# Patient Record
Sex: Female | Born: 1970
Health system: Southern US, Community
[De-identification: ages and names within clinical notes are randomized; demographics above are authoritative.]

## PROBLEM LIST (undated history)

## (undated) DIAGNOSIS — M1990 Unspecified osteoarthritis, unspecified site: Secondary | ICD-10-CM

## (undated) DIAGNOSIS — R011 Cardiac murmur, unspecified: Secondary | ICD-10-CM

## (undated) DIAGNOSIS — D649 Anemia, unspecified: Secondary | ICD-10-CM

## (undated) DIAGNOSIS — I10 Essential (primary) hypertension: Secondary | ICD-10-CM

## (undated) DIAGNOSIS — N912 Amenorrhea, unspecified: Secondary | ICD-10-CM

## (undated) DIAGNOSIS — J45909 Unspecified asthma, uncomplicated: Secondary | ICD-10-CM

## (undated) DIAGNOSIS — D352 Benign neoplasm of pituitary gland: Secondary | ICD-10-CM

## (undated) HISTORY — DX: Unspecified asthma, uncomplicated: J45.909

## (undated) HISTORY — DX: Amenorrhea, unspecified: N91.2

## (undated) HISTORY — DX: Unspecified osteoarthritis, unspecified site: M19.90

## (undated) HISTORY — DX: Cardiac murmur, unspecified: R01.1

## (undated) HISTORY — DX: Benign neoplasm of pituitary gland: D35.2

## (undated) HISTORY — PX: TUBAL LIGATION: SHX77

---

## 1998-07-18 ENCOUNTER — Encounter: Admission: RE | Admit: 1998-07-18 | Discharge: 1998-10-16 | Payer: Self-pay | Admitting: Internal Medicine

## 1998-07-22 ENCOUNTER — Emergency Department (HOSPITAL_COMMUNITY): Admission: EM | Admit: 1998-07-22 | Discharge: 1998-07-22 | Payer: Self-pay | Admitting: Emergency Medicine

## 1998-07-24 ENCOUNTER — Emergency Department (HOSPITAL_COMMUNITY): Admission: EM | Admit: 1998-07-24 | Discharge: 1998-07-24 | Payer: Self-pay | Admitting: Emergency Medicine

## 2001-12-08 ENCOUNTER — Emergency Department (HOSPITAL_COMMUNITY): Admission: EM | Admit: 2001-12-08 | Discharge: 2001-12-08 | Payer: Self-pay

## 2001-12-09 ENCOUNTER — Inpatient Hospital Stay (HOSPITAL_COMMUNITY): Admission: AD | Admit: 2001-12-09 | Discharge: 2001-12-09 | Payer: Self-pay | Admitting: *Deleted

## 2001-12-09 ENCOUNTER — Encounter: Payer: Self-pay | Admitting: *Deleted

## 2001-12-12 ENCOUNTER — Inpatient Hospital Stay (HOSPITAL_COMMUNITY): Admission: AD | Admit: 2001-12-12 | Discharge: 2001-12-12 | Payer: Self-pay | Admitting: Obstetrics and Gynecology

## 2001-12-15 ENCOUNTER — Inpatient Hospital Stay (HOSPITAL_COMMUNITY): Admission: AD | Admit: 2001-12-15 | Discharge: 2001-12-15 | Payer: Self-pay | Admitting: Obstetrics

## 2001-12-21 ENCOUNTER — Inpatient Hospital Stay (HOSPITAL_COMMUNITY): Admission: AD | Admit: 2001-12-21 | Discharge: 2001-12-21 | Payer: Self-pay | Admitting: Obstetrics & Gynecology

## 2002-11-14 ENCOUNTER — Encounter: Payer: Self-pay | Admitting: Obstetrics & Gynecology

## 2002-11-14 ENCOUNTER — Ambulatory Visit (HOSPITAL_COMMUNITY): Admission: RE | Admit: 2002-11-14 | Discharge: 2002-11-14 | Payer: Self-pay | Admitting: Obstetrics & Gynecology

## 2002-12-12 ENCOUNTER — Inpatient Hospital Stay (HOSPITAL_COMMUNITY): Admission: AD | Admit: 2002-12-12 | Discharge: 2002-12-12 | Payer: Self-pay | Admitting: Obstetrics

## 2003-01-21 ENCOUNTER — Inpatient Hospital Stay (HOSPITAL_COMMUNITY): Admission: AD | Admit: 2003-01-21 | Discharge: 2003-01-21 | Payer: Self-pay | Admitting: Obstetrics

## 2003-01-30 ENCOUNTER — Ambulatory Visit (HOSPITAL_COMMUNITY): Admission: RE | Admit: 2003-01-30 | Discharge: 2003-01-30 | Payer: Self-pay | Admitting: Obstetrics

## 2003-01-30 ENCOUNTER — Encounter: Payer: Self-pay | Admitting: Obstetrics

## 2003-02-15 ENCOUNTER — Inpatient Hospital Stay (HOSPITAL_COMMUNITY): Admission: AD | Admit: 2003-02-15 | Discharge: 2003-02-18 | Payer: Self-pay | Admitting: Obstetrics

## 2003-02-19 ENCOUNTER — Encounter: Admission: RE | Admit: 2003-02-19 | Discharge: 2003-03-21 | Payer: Self-pay | Admitting: Obstetrics

## 2003-02-22 ENCOUNTER — Inpatient Hospital Stay (HOSPITAL_COMMUNITY): Admission: AD | Admit: 2003-02-22 | Discharge: 2003-02-22 | Payer: Self-pay | Admitting: Obstetrics

## 2003-03-22 ENCOUNTER — Encounter: Admission: RE | Admit: 2003-03-22 | Discharge: 2003-04-21 | Payer: Self-pay | Admitting: Obstetrics

## 2003-05-22 ENCOUNTER — Encounter: Admission: RE | Admit: 2003-05-22 | Discharge: 2003-06-21 | Payer: Self-pay | Admitting: Obstetrics

## 2003-07-22 ENCOUNTER — Encounter: Admission: RE | Admit: 2003-07-22 | Discharge: 2003-08-21 | Payer: Self-pay | Admitting: Obstetrics

## 2003-08-22 ENCOUNTER — Encounter: Admission: RE | Admit: 2003-08-22 | Discharge: 2003-09-21 | Payer: Self-pay | Admitting: Obstetrics

## 2003-10-22 ENCOUNTER — Encounter: Admission: RE | Admit: 2003-10-22 | Discharge: 2003-11-21 | Payer: Self-pay | Admitting: Obstetrics

## 2005-01-09 ENCOUNTER — Emergency Department (HOSPITAL_COMMUNITY): Admission: EM | Admit: 2005-01-09 | Discharge: 2005-01-09 | Payer: Self-pay | Admitting: Emergency Medicine

## 2005-03-05 ENCOUNTER — Emergency Department (HOSPITAL_COMMUNITY): Admission: EM | Admit: 2005-03-05 | Discharge: 2005-03-05 | Payer: Self-pay | Admitting: Emergency Medicine

## 2005-11-14 ENCOUNTER — Emergency Department (HOSPITAL_COMMUNITY): Admission: EM | Admit: 2005-11-14 | Discharge: 2005-11-14 | Payer: Self-pay | Admitting: Emergency Medicine

## 2006-11-16 ENCOUNTER — Emergency Department (HOSPITAL_COMMUNITY): Admission: EM | Admit: 2006-11-16 | Discharge: 2006-11-16 | Payer: Self-pay | Admitting: Family Medicine

## 2006-11-20 ENCOUNTER — Emergency Department (HOSPITAL_COMMUNITY): Admission: EM | Admit: 2006-11-20 | Discharge: 2006-11-20 | Payer: Self-pay | Admitting: Family Medicine

## 2011-10-17 ENCOUNTER — Other Ambulatory Visit: Payer: Self-pay | Admitting: Internal Medicine

## 2012-08-16 ENCOUNTER — Ambulatory Visit
Admission: RE | Admit: 2012-08-16 | Discharge: 2012-08-16 | Disposition: A | Discharge status: Home / Self Care | Payer: 59 | Source: Ambulatory Visit | Attending: Orthopedic Surgery | Admitting: Orthopedic Surgery

## 2012-08-16 ENCOUNTER — Other Ambulatory Visit: Payer: Self-pay | Admitting: Orthopedic Surgery

## 2012-08-16 DIAGNOSIS — M25561 Pain in right knee: Secondary | ICD-10-CM

## 2013-06-06 ENCOUNTER — Ambulatory Visit: Payer: Self-pay | Admitting: Obstetrics

## 2013-06-06 ENCOUNTER — Ambulatory Visit (INDEPENDENT_AMBULATORY_CARE_PROVIDER_SITE_OTHER): Payer: 59 | Admitting: Family Medicine

## 2013-06-06 VITALS — BP 144/88 | HR 80 | Temp 98.1°F | Resp 16 | Ht 65.5 in | Wt 231.2 lb

## 2013-06-06 DIAGNOSIS — B9689 Other specified bacterial agents as the cause of diseases classified elsewhere: Secondary | ICD-10-CM

## 2013-06-06 DIAGNOSIS — N946 Dysmenorrhea, unspecified: Secondary | ICD-10-CM

## 2013-06-06 DIAGNOSIS — IMO0002 Reserved for concepts with insufficient information to code with codable children: Secondary | ICD-10-CM

## 2013-06-06 DIAGNOSIS — R109 Unspecified abdominal pain: Secondary | ICD-10-CM

## 2013-06-06 DIAGNOSIS — N926 Irregular menstruation, unspecified: Secondary | ICD-10-CM

## 2013-06-06 DIAGNOSIS — N760 Acute vaginitis: Secondary | ICD-10-CM

## 2013-06-06 DIAGNOSIS — N898 Other specified noninflammatory disorders of vagina: Secondary | ICD-10-CM

## 2013-06-06 LAB — POCT URINALYSIS DIPSTICK
Bilirubin, UA: NEGATIVE
Blood, UA: NEGATIVE
Glucose, UA: NEGATIVE
Ketones, UA: NEGATIVE
Leukocytes, UA: NEGATIVE
Nitrite, UA: NEGATIVE
Protein, UA: NEGATIVE
Spec Grav, UA: 1.02
Urobilinogen, UA: 0.2
pH, UA: 5

## 2013-06-06 LAB — POCT UA - MICROSCOPIC ONLY
Casts, Ur, LPF, POC: NEGATIVE
Crystals, Ur, HPF, POC: NEGATIVE

## 2013-06-06 LAB — POCT WET PREP WITH KOH
Bacteria Wet Prep HPF POC: 2
KOH Prep POC: NEGATIVE
Yeast Wet Prep HPF POC: NEGATIVE

## 2013-06-06 LAB — POCT URINE PREGNANCY: Preg Test, Ur: NEGATIVE

## 2013-06-06 MED ORDER — METRONIDAZOLE 500 MG PO TABS: ORAL_TABLET | ORAL | Status: DC

## 2013-06-06 NOTE — Patient Instructions (Addendum)
You should receive a call or letter about your lab results within the next week to 10 days. Start the antibiotic for vaginal discharge - see below.  If menses continue to be irregular or clots passed again - return to clinic for evaluation.  Return to the clinic or go to the nearest emergency room if any of your symptoms worsen or new symptoms occur.   Bacterial Vaginosis Bacterial vaginosis (BV) is a vaginal infection where the normal balance of bacteria in the vagina is disrupted. The normal balance is then replaced by an overgrowth of certain bacteria. There are several different kinds of bacteria that can cause BV. BV is the most common vaginal infection in women of childbearing age. CAUSES   The cause of BV is not fully understood. BV develops when there is an increase or imbalance of harmful bacteria.  Some activities or behaviors can upset the normal balance of bacteria in the vagina and put women at increased risk including:  Having a new sex partner or multiple sex partners.  Douching.  Using an intrauterine device (IUD) for contraception.  It is not clear what role sexual activity plays in the development of BV. However, women that have never had sexual intercourse are rarely infected with BV. Women do not get BV from toilet seats, bedding, swimming pools or from touching objects around them.  SYMPTOMS   Grey vaginal discharge.  A fish-like odor with discharge, especially after sexual intercourse.  Itching or burning of the vagina and vulva.  Burning or pain with urination.  Some women have no signs or symptoms at all. DIAGNOSIS  Your caregiver must examine the vagina for signs of BV. Your caregiver will perform lab tests and look at the sample of vaginal fluid through a microscope. They will look for bacteria and abnormal cells (clue cells), a pH test higher than 4.5, and a positive amine test all associated with BV.  RISKS AND COMPLICATIONS   Pelvic inflammatory  disease (PID).  Infections following gynecology surgery.  Developing HIV.  Developing herpes virus. TREATMENT  Sometimes BV will clear up without treatment. However, all women with symptoms of BV should be treated to avoid complications, especially if gynecology surgery is planned. Female partners generally do not need to be treated. However, BV may spread between female sex partners so treatment is helpful in preventing a recurrence of BV.   BV may be treated with antibiotics. The antibiotics come in either pill or vaginal cream forms. Either can be used with nonpregnant or pregnant women, but the recommended dosages differ. These antibiotics are not harmful to the baby.  BV can recur after treatment. If this happens, a second round of antibiotics will often be prescribed.  Treatment is important for pregnant women. If not treated, BV can cause a premature delivery, especially for a pregnant woman who had a premature birth in the past. All pregnant women who have symptoms of BV should be checked and treated.  For chronic reoccurrence of BV, treatment with a type of prescribed gel vaginally twice a week is helpful. HOME CARE INSTRUCTIONS   Finish all medication as directed by your caregiver.  Do not have sex until treatment is completed.  Tell your sexual partner that you have a vaginal infection. They should see their caregiver and be treated if they have problems, such as a mild rash or itching.  Practice safe sex. Use condoms. Only have 1 sex partner. PREVENTION  Basic prevention steps can help reduce the risk of  upsetting the natural balance of bacteria in the vagina and developing BV:  Do not have sexual intercourse (be abstinent).  Do not douche.  Use all of the medicine prescribed for treatment of BV, even if the signs and symptoms go away.  Tell your sex partner if you have BV. That way, they can be treated, if needed, to prevent reoccurrence. SEEK MEDICAL CARE IF:   Your  symptoms are not improving after 3 days of treatment.  You have increased discharge, pain, or fever. MAKE SURE YOU:   Understand these instructions.  Will watch your condition.  Will get help right away if you are not doing well or get worse. FOR MORE INFORMATION  Division of STD Prevention (DSTDP), Centers for Disease Control and Prevention: [employer] American Social Health Association (ASHA): www.ashastd.org  Document Released: 11/29/2005 Document Revised: 02/21/2012 Document Reviewed: 05/22/2009 [facility] Patient Information 2014 [place], [place].

## 2013-06-06 NOTE — Progress Notes (Signed)
Subjective:    Patient ID: Whitney Gray, female    DOB: October 13, 1971, 42 y.o.   MRN: [id number]  HPI Whitney Gray is a 42 y.o. female  2 months ago - went to bathroom - not sure if tampon was taken out or not.  Has not felt strings or FB, but not sure. Slight vaginal discharge on and off past 2 months.  LMP last week - lighter than usual, but had some lingering bleeding, then clots last night.  No menstrual bleeding today.  Occasional cramps after intercourse past month. No contraception. No STI's in past. No extramarital intercourse.  No recent abx.   Last pap 2 years ago - normal. Would like this repeated tonight if possible. Declines sti testing.  Hx BTL.   Review of Systems  Constitutional: Negative for fever and chills.  Gastrointestinal: Positive for abdominal pain (slight lower cramping after intercourse only. ). Negative for nausea, vomiting, diarrhea, blood in stool and anal bleeding.       No recent n/v.  Seen by PCP few weeks ago for illness - better now.  Genitourinary: Positive for vaginal bleeding, vaginal discharge and menstrual problem. Negative for dysuria, urgency, frequency and hematuria.  Skin: Negative for rash.       Objective:   Physical Exam  Vitals reviewed. Constitutional: She is oriented to person, place, and time. She appears well-developed and well-nourished.  Pulmonary/Chest: Effort normal.  Abdominal: Soft. Bowel sounds are normal. She exhibits no distension. There is no rebound and no guarding.  nontender but feeling of fullness in lower suprapubic area.   Genitourinary: Uterus normal. There is no rash, tenderness, lesion or injury on the right labia. There is no rash, tenderness, lesion or injury on the left labia. Cervix exhibits no motion tenderness and no discharge. Right adnexum displays no mass, no tenderness and no fullness. Left adnexum displays no mass, no tenderness and no fullness. No erythema or bleeding around the vagina. No foreign body  around the vagina. No signs of injury around the vagina. Vaginal discharge (small amount white d/c in vault ) found.  Neurological: She is alert and oriented to person, place, and time.  Skin: Skin is warm and dry. No rash noted.  Psychiatric: She has a normal mood and affect. Her behavior is normal.    Results for orders placed in visit on 06/06/13  POCT URINALYSIS DIPSTICK      Result Value Range   Color, UA yellow     Clarity, UA hazy     Glucose, UA neg     Bilirubin, UA neg     Ketones, UA neg     Spec Grav, UA 1.020     Blood, UA neg     pH, UA 5.0     Protein, UA neg     Urobilinogen, UA 0.2     Nitrite, UA neg     Leukocytes, UA Negative    POCT UA - MICROSCOPIC ONLY      Result Value Range   WBC, Ur, HPF, POC 0-1     RBC, urine, microscopic 0-1     Bacteria, U Microscopic small     Mucus, UA neg     Epithelial cells, urine per micros 2-6     Crystals, Ur, HPF, POC neg     Casts, Ur, LPF, POC neg     Yeast, UA neg    POCT URINE PREGNANCY      Result Value Range   Preg Test,  Ur Negative    POCT WET PREP WITH KOH      Result Value Range   Trichomonas, UA Negative     Clue Cells Wet Prep HPF POC 8-12     Epithelial Wet Prep HPF POC neg     Yeast Wet Prep HPF POC neg     Bacteria Wet Prep HPF POC 2     RBC Wet Prep HPF POC 0-1     WBC Wet Prep HPF POC 4-6     KOH Prep POC Negative        Assessment & Plan:  Whitney Gray is a 42 y.o. female Abdominal  pain, other specified site - Plan: POCT urinalysis dipstick, POCT UA - Microscopic Only, POCT urine pregnancy, POCT Wet Prep with KOH, Pap IG w/ reflex to HPV when ASC-U Vaginal discharge - Plan: POCT urine pregnancy, POCT Wet Prep with KOH, metroNIDAZOLE (FLAGYL) 500 MG tablet, Bacterial vaginosis - Plan: metroNIDAZOLE (FLAGYL) 500 MG tablet.  No retained tampon visualized.  Recheck if clots passed again or irregular menses or abdominal symptoms not improving.   Abnormal menses - Plan: POCT urine pregnancy,  POCT Wet Prep with KOH, Pap IG w/ reflex to HPV when ASC-U.  As above.   Meds ordered this encounter  Medications  . metroNIDAZOLE (FLAGYL) 500 MG tablet    Sig: 1 pill by mouth twice per day.  Avoid any alcohol while taking this medicine.    Dispense:  14 tablet    Refill:  0   Patient Instructions  You should receive a call or letter about your lab results within the next week to 10 days. Start the antibiotic for vaginal discharge - see below.  If menses continue to be irregular or clots passed again - return to clinic for evaluation.  Return to the clinic or go to the nearest emergency room if any of your symptoms worsen or new symptoms occur.

## 2013-06-08 LAB — PAP IG W/ RFLX HPV ASCU

## 2013-07-02 ENCOUNTER — Ambulatory Visit: Payer: Self-pay | Admitting: Obstetrics

## 2015-02-14 DIAGNOSIS — E559 Vitamin D deficiency, unspecified: Secondary | ICD-10-CM | POA: Insufficient documentation

## 2015-02-14 DIAGNOSIS — D573 Sickle-cell trait: Secondary | ICD-10-CM | POA: Insufficient documentation

## 2015-03-13 DIAGNOSIS — J309 Allergic rhinitis, unspecified: Secondary | ICD-10-CM | POA: Insufficient documentation

## 2015-06-23 ENCOUNTER — Encounter: Payer: Self-pay | Admitting: Obstetrics

## 2015-06-23 ENCOUNTER — Ambulatory Visit (INDEPENDENT_AMBULATORY_CARE_PROVIDER_SITE_OTHER): Payer: 59 | Admitting: Obstetrics

## 2015-06-23 ENCOUNTER — Other Ambulatory Visit: Payer: Self-pay | Admitting: Obstetrics

## 2015-06-23 VITALS — BP 131/85 | HR 73 | Temp 98.2°F | Ht 63.0 in | Wt 224.0 lb

## 2015-06-23 DIAGNOSIS — Z113 Encounter for screening for infections with a predominantly sexual mode of transmission: Secondary | ICD-10-CM

## 2015-06-23 DIAGNOSIS — Z0000 Encounter for general adult medical examination without abnormal findings: Secondary | ICD-10-CM

## 2015-06-23 DIAGNOSIS — Z01419 Encounter for gynecological examination (general) (routine) without abnormal findings: Secondary | ICD-10-CM

## 2015-06-23 DIAGNOSIS — E669 Obesity, unspecified: Secondary | ICD-10-CM

## 2015-06-23 DIAGNOSIS — Z124 Encounter for screening for malignant neoplasm of cervix: Secondary | ICD-10-CM | POA: Diagnosis not present

## 2015-06-23 MED ORDER — PHENTERMINE HCL 37.5 MG PO CAPS: 37.5000 mg | ORAL_CAPSULE | ORAL | Status: DC

## 2015-06-23 NOTE — Progress Notes (Signed)
Subjective:        Whitney Gray is a 44 y.o. female here for a routine exam.  Current complaints: Undesirable weight gain.    Personal health questionnaire:  Is patient Ashkenazi Jewish, have a family history of breast and/or ovarian cancer: no Is there a family history of uterine cancer diagnosed at age < 51, gastrointestinal cancer, urinary tract cancer, family member who is a Field seismologist syndrome-associated carrier: no Is the patient overweight and hypertensive, family history of diabetes, personal history of gestational diabetes, preeclampsia or PCOS: no Is patient over 23, have PCOS,  family history of premature CHD under age 104, diabetes, smoke, have hypertension or peripheral artery disease:  no At any time, has a partner hit, kicked or otherwise hurt or frightened you?: no Over the past 2 weeks, have you felt down, depressed or hopeless?: no Over the past 2 weeks, have you felt little interest or pleasure in doing things?:no   Gynecologic History No LMP recorded. Contraception: tubal ligation Last Pap: 2015. Results were: normal Last mammogram: 2016. Results were: normal  Obstetric History OB History  No data available    Past Medical History  Diagnosis Date  . Arthritis     Past Surgical History  Procedure Laterality Date  . Tubal ligation       Current outpatient prescriptions:  .  phentermine 37.5 MG capsule, Take 1 capsule (37.5 mg total) by mouth every morning., Disp: 30 capsule, Rfl: 2 Allergies  Allergen Reactions  . Codeine Nausea Only    History  Substance Use Topics  . Smoking status: Never Smoker   . Smokeless tobacco: Not on file  . Alcohol Use: No    History reviewed. No pertinent family history.    Review of Systems  Constitutional: negative for fatigue and weight loss Respiratory: negative for cough and wheezing Cardiovascular: negative for chest pain, fatigue and palpitations Gastrointestinal: negative for abdominal pain and change in  bowel habits Musculoskeletal:negative for myalgias Neurological: negative for gait problems and tremors Behavioral/Psych: negative for abusive relationship, depression Endocrine: negative for temperature intolerance   Genitourinary:negative for abnormal menstrual periods, genital lesions, hot flashes, sexual problems and vaginal discharge Integument/breast: negative for breast lump, breast tenderness, nipple discharge and skin lesion(s)    Objective:       BP 131/85 mmHg  Pulse 73  Temp(Src) 98.2 F (36.8 C)  Ht 5\' 3"  (1.6 m)  Wt 224 lb (101.606 kg)  BMI 39.69 kg/m2 General:   alert  Skin:   no rash or abnormalities  Lungs:   clear to auscultation bilaterally  Heart:   regular rate and rhythm, S1, S2 normal, no murmur, click, rub or gallop  Breasts:   normal without suspicious masses, skin or nipple changes or axillary nodes  Abdomen:  normal findings: no organomegaly, soft, non-tender and no hernia  Pelvis:  External genitalia: normal general appearance Urinary system: urethral meatus normal and bladder without fullness, nontender Vaginal: normal without tenderness, induration or masses Cervix: normal appearance Adnexa: normal bimanual exam Uterus: anteverted and non-tender, normal size   Lab Review Urine pregnancy test Labs reviewed yes Radiologic studies reviewed yes    Assessment:    Healthy female exam.    Obesity.  Interested in Conservation officer, nature.   Plan:    Education reviewed: calcium supplements, low fat, low cholesterol diet, safe sex/STD prevention, self breast exams and weight bearing exercise. Mammogram ordered. Follow up in: 1 year.  Phentermine Rx for weight management, along with diet and  exercise.  Meds ordered this encounter  Medications  . phentermine 37.5 MG capsule    Sig: Take 1 capsule (37.5 mg total) by mouth every morning.    Dispense:  30 capsule    Refill:  2   Orders Placed This Encounter  Procedures  . HIV antibody  . Hepatitis  B surface antigen  . RPR  . Hepatitis C antibody  . Ambulatory referral to Internal Medicine    Referral Priority:  Routine    Referral Type:  Consultation    Referral Reason:  Specialty Services Required    Requested Specialty:  Internal Medicine    Number of Visits Requested:  1

## 2015-06-23 NOTE — Addendum Note (Signed)
Addended by: Carole Binning on: 06/23/2015 04:25 PM   Modules accepted: Orders

## 2015-06-27 LAB — PAP LB, CT-NG NAA, HPV HIGH-RISK
Chlamydia, Nuc. Acid Amp: NEGATIVE
Gonococcus, Nuc. Acid Amp: NEGATIVE
HPV, high-risk: NEGATIVE
PAP SMEAR COMMENT: 0

## 2015-07-03 ENCOUNTER — Telehealth: Payer: Self-pay | Admitting: *Deleted

## 2015-07-03 NOTE — Telephone Encounter (Signed)
Patient contacted the office requesting her lab results. Lab results reviewed and patient verbalized understanding.

## 2015-07-10 ENCOUNTER — Ambulatory Visit (INDEPENDENT_AMBULATORY_CARE_PROVIDER_SITE_OTHER): Payer: 59 | Admitting: Family

## 2015-07-10 ENCOUNTER — Encounter: Payer: Self-pay | Admitting: Family

## 2015-07-10 VITALS — BP 132/96 | HR 73 | Temp 98.0°F | Resp 18 | Ht 63.0 in | Wt 224.0 lb

## 2015-07-10 DIAGNOSIS — L219 Seborrheic dermatitis, unspecified: Secondary | ICD-10-CM

## 2015-07-10 DIAGNOSIS — J302 Other seasonal allergic rhinitis: Secondary | ICD-10-CM | POA: Diagnosis not present

## 2015-07-10 DIAGNOSIS — E669 Obesity, unspecified: Secondary | ICD-10-CM

## 2015-07-10 DIAGNOSIS — D352 Benign neoplasm of pituitary gland: Secondary | ICD-10-CM | POA: Diagnosis not present

## 2015-07-10 DIAGNOSIS — L210 Seborrhea capitis: Secondary | ICD-10-CM | POA: Insufficient documentation

## 2015-07-10 NOTE — Progress Notes (Signed)
Subjective:    Patient ID: Whitney Gray, female    DOB: 21-Jun-1971, 44 y.o.   MRN: [id number]  Chief Complaint  Patient presents with  . Establish Care    states that she is having a bad year with allergies and having trouble breathing at night, would also like to see if she could get a rx shampoo to help with dandruff    HPI:  Whitney Gray is a 44 y.o. female with a PMH of arthritis, prolactinemia, pituitary adenoma, and heart murmur who presents today for an office visit to establish care.   1.) Dandruff - Associated symptom of dandruff located in her hair has been going on for several months. Reports that the shampoo that was recommend by her hair stylist has caused her to increase dandruff. She has since stopped using that shampoo. Modifying factors include Head and Shoulders which has not helped very much. Would like to know any alternative shampoos to help with dandruff.  2.) Allergies - Associated symptoms of increased seasonal allergies and has been going on since the start of the spring season.. Modifying factors include Claratin which significantly helped with her symptoms. Timing of symptoms is worse at night. She has since discontinued taking Claritin and does notice her symptoms are returning.    3.) Pituitary Adenoma - Previously diagnosed with a pituitary adenoma which was seen on MRI. Formerly maintained on cabergoline for control. Reports this increased her migraine headaches. She recently had a prolactin level drawn, however does not know the results as they were done with a previous provider.   Allergies  Allergen Reactions  . Codeine Nausea Only     Outpatient Prescriptions Prior to Visit  Medication Sig Dispense Refill  . phentermine 37.5 MG capsule Take 1 capsule (37.5 mg total) by mouth every morning. 30 capsule 2   No facility-administered medications prior to visit.     Past Medical History  Diagnosis Date  . Arthritis     Bilateral knees  .  Heart murmur   . Prolactinoma   . Amenorrhea   . Pituitary adenoma      Past Surgical History  Procedure Laterality Date  . Tubal ligation    . Cesarean section       Family History  Problem Relation Age of Onset  . Asthma Mother   . Alcohol abuse Father   . Arthritis Father   . Heart disease Father   . Prostate cancer Maternal Grandfather   . Diabetes Maternal Grandfather      History   Social History  . Marital Status: Married    Spouse Name: N/A  . Number of Children: 2  . Years of Education: 18   Occupational History  . [employer]    Social History Main Topics  . Smoking status: Never Smoker   . Smokeless tobacco: Never Used  . Alcohol Use: No  . Drug Use: No  . Sexual Activity:    Partners: Male   Other Topics Concern  . Not on file   Social History Narrative   Fun: Play games   Denies abuse and feels safe at home.   Denies religious beliefs effecting health care.     Review of Systems  Constitutional: Positive for chills. Negative for fever.  HENT: Positive for congestion, rhinorrhea and sneezing.   Respiratory: Negative for chest tightness and shortness of breath.       Objective:    BP 132/96 mmHg  Pulse 73  Temp(Src)  98 F (36.7 C) (Oral)  Resp 18  Ht 5\' 3"  (1.6 m)  Wt 224 lb (101.606 kg)  BMI 39.69 kg/m2  SpO2 93% Nursing note and vital signs reviewed.  Physical Exam  Constitutional: She is oriented to person, place, and time. She appears well-developed and well-nourished. No distress.  HENT:  Right Ear: Hearing, tympanic membrane, external ear and ear canal normal.  Left Ear: Hearing, tympanic membrane, external ear and ear canal normal.  Nose: Nose normal.  Mouth/Throat: Uvula is midline, oropharynx is clear and moist and mucous membranes are normal.  Cardiovascular: Normal rate, regular rhythm, normal heart sounds and intact distal pulses.   Pulmonary/Chest: Effort normal and breath sounds normal.  Neurological: She is alert  and oriented to person, place, and time.  Skin: Skin is warm and dry.  Psychiatric: She has a normal mood and affect. Her behavior is normal. Judgment and thought content normal.       Assessment & Plan:   Problem List Items Addressed This Visit    None

## 2015-07-10 NOTE — Assessment & Plan Note (Signed)
BMI of 39.69. Emphasized the importance of weight loss through diet and exercise with lifestyle/behavior change. Obtain A1c to rule out diabetes. Continue current dosage of phentermine with caution secondary to increased blood pressure.

## 2015-07-10 NOTE — Assessment & Plan Note (Signed)
Appears stable at present. Obtain prolactin level. Follow up pending lab work.

## 2015-07-10 NOTE — Assessment & Plan Note (Signed)
Symptoms consistent with uncontrolled allergies. Restart Claratin daily. Advised to use Afrin sparingly secondary to rebound congestion. Consider addition of singulair if allergies remain uncontrolled as she had nosebleeds from the inhaled corticosteroids.

## 2015-07-10 NOTE — Patient Instructions (Addendum)
Thank you for choosing [facility].  Summary/Instructions:  Please start selsun blue for your dandruff.   Please start Claratin for your allergies. Be cautious with Afrin.   If your symptoms worsen or fail to improve, please contact our office for further instruction, or in case of emergency go directly to the emergency room at the closest medical facility.    Allergic Rhinitis Allergic rhinitis is when the mucous membranes in the nose respond to allergens. Allergens are particles in the air that cause your body to have an allergic reaction. This causes you to release allergic antibodies. Through a chain of events, these eventually cause you to release histamine into the blood stream. Although meant to protect the body, it is this release of histamine that causes your discomfort, such as frequent sneezing, congestion, and an itchy, runny nose.  CAUSES  Seasonal allergic rhinitis (hay fever) is caused by pollen allergens that may come from grasses, trees, and weeds. Year-round allergic rhinitis (perennial allergic rhinitis) is caused by allergens such as house dust mites, pet dander, and mold spores.  SYMPTOMS   Nasal stuffiness (congestion).  Itchy, runny nose with sneezing and tearing of the eyes. DIAGNOSIS  Your health care provider can help you determine the allergen or allergens that trigger your symptoms. If you and your health care provider are unable to determine the allergen, skin or blood testing may be used. TREATMENT  Allergic rhinitis does not have a cure, but it can be controlled by:  Medicines and allergy shots (immunotherapy).  Avoiding the allergen. Hay fever may often be treated with antihistamines in pill or nasal spray forms. Antihistamines block the effects of histamine. There are over-the-counter medicines that may help with nasal congestion and swelling around the eyes. Check with your health care provider before taking or giving this medicine.  If avoiding  the allergen or the medicine prescribed do not work, there are many new medicines your health care provider can prescribe. Stronger medicine may be used if initial measures are ineffective. Desensitizing injections can be used if medicine and avoidance does not work. Desensitization is when a patient is given ongoing shots until the body becomes less sensitive to the allergen. Make sure you follow up with your health care provider if problems continue. HOME CARE INSTRUCTIONS It is not possible to completely avoid allergens, but you can reduce your symptoms by taking steps to limit your exposure to them. It helps to know exactly what you are allergic to so that you can avoid your specific triggers. SEEK MEDICAL CARE IF:   You have a fever.  You develop a cough that does not stop easily (persistent).  You have shortness of breath.  You start wheezing.  Symptoms interfere with normal daily activities. Document Released: 08/24/2001 Document Revised: 12/04/2013 Document Reviewed: 08/06/2013 [facility] Patient Information 2015 [place], [place]. This information is not intended to replace advice given to you by your health care provider. Make sure you discuss any questions you have with your health care provider.

## 2015-07-10 NOTE — Progress Notes (Signed)
Pre visit review using our clinic review tool, if applicable. No additional management support is needed unless otherwise documented below in the visit note. 

## 2016-06-23 ENCOUNTER — Ambulatory Visit: Payer: 59 | Admitting: Obstetrics

## 2016-07-07 ENCOUNTER — Ambulatory Visit: Payer: 59 | Admitting: Obstetrics

## 2016-08-12 ENCOUNTER — Ambulatory Visit (INDEPENDENT_AMBULATORY_CARE_PROVIDER_SITE_OTHER): Payer: 59 | Admitting: Obstetrics

## 2016-08-12 ENCOUNTER — Encounter: Payer: Self-pay | Admitting: *Deleted

## 2016-08-12 ENCOUNTER — Encounter: Payer: Self-pay | Admitting: Obstetrics

## 2016-08-12 VITALS — BP 128/81 | HR 95 | Temp 99.0°F | Ht 63.0 in | Wt 240.1 lb

## 2016-08-12 DIAGNOSIS — M25569 Pain in unspecified knee: Secondary | ICD-10-CM

## 2016-08-12 DIAGNOSIS — Z124 Encounter for screening for malignant neoplasm of cervix: Secondary | ICD-10-CM

## 2016-08-12 DIAGNOSIS — Z01419 Encounter for gynecological examination (general) (routine) without abnormal findings: Secondary | ICD-10-CM

## 2016-08-12 DIAGNOSIS — Z1239 Encounter for other screening for malignant neoplasm of breast: Secondary | ICD-10-CM

## 2016-08-12 MED ORDER — IBUPROFEN 800 MG PO TABS: 800.0000 mg | ORAL_TABLET | Freq: Three times a day (TID) | ORAL | 5 refills | Status: DC | PRN

## 2016-08-12 NOTE — Progress Notes (Signed)
Patient ID: Whitney Gray, female   DOB: 01-19-1971, 45 y.o.   MRN: [id number]   Subjective:        Whitney Gray is a 45 y.o. female here for a routine exam.  Current complaints: None.    Personal health questionnaire:  Is patient Ashkenazi Jewish, have a family history of breast and/or ovarian cancer: no Is there a family history of uterine cancer diagnosed at age < 58, gastrointestinal cancer, urinary tract cancer, family member who is a Field seismologist syndrome-associated carrier: no Is the patient overweight and hypertensive, family history of diabetes, personal history of gestational diabetes, preeclampsia or PCOS: no Is patient over 47, have PCOS,  family history of premature CHD under age 46, diabetes, smoke, have hypertension or peripheral artery disease:  no At any time, has a partner hit, kicked or otherwise hurt or frightened you?: no Over the past 2 weeks, have you felt down, depressed or hopeless?: no Over the past 2 weeks, have you felt little interest or pleasure in doing things?:no   Gynecologic History No LMP recorded (lmp unknown). Contraception: tubal ligation Last Pap: 2016. Results were: normal Last mammogram: ~3 yrs. . Results were: normal  Obstetric History OB History  No data available    Past Medical History:  Diagnosis Date  . Amenorrhea   . Arthritis    Bilateral knees  . Heart murmur   . Pituitary adenoma ([place])   . Prolactinoma [facility])     Past Surgical History:  Procedure Laterality Date  . CESAREAN SECTION    . TUBAL LIGATION       Current Outpatient Prescriptions:  .  phentermine 37.5 MG capsule, Take 1 capsule (37.5 mg total) by mouth every morning. (Patient not taking: Reported on 08/12/2016), Disp: 30 capsule, Rfl: 2 Allergies  Allergen Reactions  . Codeine Nausea Only    Social History  Substance Use Topics  . Smoking status: Never Smoker  . Smokeless tobacco: Never Used  . Alcohol use No    Family History  Problem Relation Age of  Onset  . Asthma Mother   . Alcohol abuse Father   . Arthritis Father   . Heart disease Father   . Prostate cancer Maternal Grandfather   . Diabetes Maternal Grandfather       Review of Systems  Constitutional: negative for fatigue and weight loss Respiratory: negative for cough and wheezing Cardiovascular: negative for chest pain, fatigue and palpitations Gastrointestinal: negative for abdominal pain and change in bowel habits Musculoskeletal:negative for myalgias Neurological: negative for gait problems and tremors Behavioral/Psych: negative for abusive relationship, depression Endocrine: negative for temperature intolerance   Genitourinary:negative for abnormal menstrual periods, genital lesions, hot flashes, sexual problems and vaginal discharge Integument/breast: negative for breast lump, breast tenderness, nipple discharge and skin lesion(s)    Objective:       BP 128/81   Pulse 95   Temp 99 F (37.2 C) (Oral)   Ht 5\' 3"  (1.6 m)   Wt 240 lb 1.6 oz (108.9 kg)   LMP  (LMP Unknown)   BMI 42.53 kg/m  General:   alert  Skin:   no rash or abnormalities  Lungs:   clear to auscultation bilaterally  Heart:   regular rate and rhythm, S1, S2 normal, no murmur, click, rub or gallop  Breasts:   normal without suspicious masses, skin or nipple changes or axillary nodes  Abdomen:  normal findings: no organomegaly, soft, non-tender and no hernia  Pelvis:  External genitalia: normal  general appearance Urinary system: urethral meatus normal and bladder without fullness, nontender Vaginal: normal without tenderness, induration or masses Cervix: normal appearance Adnexa: normal bimanual exam Uterus: anteverted and non-tender, normal size   Lab Review Urine pregnancy test Labs reviewed yes Radiologic studies reviewed yes  50% of 20 min visit spent on counseling and coordination of care.   Assessment:    Healthy female exam.    Plan:    Education reviewed: calcium  supplements, low fat, low cholesterol diet, safe sex/STD prevention, self breast exams and weight bearing exercise. Mammogram ordered. Follow up in: 1 year.   No orders of the defined types were placed in this encounter.  No orders of the defined types were placed in this encounter.      Patient ID: Whitney Gray, female   DOB: [birth year], 45 y.o.   MRN: [id number]

## 2016-08-17 LAB — NUSWAB VG+, CANDIDA 6SP
CANDIDA ALBICANS, NAA: NEGATIVE
CANDIDA TROPICALIS, NAA: NEGATIVE
Candida glabrata, NAA: NEGATIVE
Candida krusei, NAA: NEGATIVE
Candida lusitaniae, NAA: NEGATIVE
Candida parapsilosis, NAA: NEGATIVE
Chlamydia trachomatis, NAA: NEGATIVE
NEISSERIA GONORRHOEAE, NAA: NEGATIVE
TRICH VAG BY NAA: NEGATIVE

## 2016-08-17 LAB — PAP IG AND HPV HIGH-RISK
HPV, HIGH-RISK: NEGATIVE
PAP Smear Comment: 0

## 2017-03-05 ENCOUNTER — Encounter (HOSPITAL_COMMUNITY): Payer: Self-pay | Admitting: Emergency Medicine

## 2017-03-05 ENCOUNTER — Ambulatory Visit (HOSPITAL_COMMUNITY)
Admission: EM | Admit: 2017-03-05 | Discharge: 2017-03-05 | Disposition: A | Discharge status: Home / Self Care | Payer: BLUE CROSS/BLUE SHIELD | Attending: Internal Medicine | Admitting: Internal Medicine

## 2017-03-05 DIAGNOSIS — J9801 Acute bronchospasm: Secondary | ICD-10-CM

## 2017-03-05 DIAGNOSIS — J301 Allergic rhinitis due to pollen: Secondary | ICD-10-CM | POA: Diagnosis not present

## 2017-03-05 MED ORDER — IPRATROPIUM-ALBUTEROL 0.5-2.5 (3) MG/3ML IN SOLN
RESPIRATORY_TRACT | Status: AC
  Filled 2017-03-05: qty 3

## 2017-03-05 MED ORDER — DEXAMETHASONE SODIUM PHOSPHATE 10 MG/ML IJ SOLN: 10.0000 mg | Freq: Once | INTRAMUSCULAR | Status: DC

## 2017-03-05 MED ORDER — PREDNISONE 20 MG PO TABS
60.0000 mg | ORAL_TABLET | Freq: Once | ORAL | Status: AC
  Administered 2017-03-05: 60 mg via ORAL

## 2017-03-05 MED ORDER — ALBUTEROL SULFATE HFA 108 (90 BASE) MCG/ACT IN AERS: 2.0000 | INHALATION_SPRAY | RESPIRATORY_TRACT | 0 refills | Status: DC | PRN

## 2017-03-05 MED ORDER — IPRATROPIUM-ALBUTEROL 0.5-2.5 (3) MG/3ML IN SOLN
3.0000 mL | Freq: Once | RESPIRATORY_TRACT | Status: AC
  Administered 2017-03-05: 3 mL via RESPIRATORY_TRACT

## 2017-03-05 MED ORDER — DEXAMETHASONE SODIUM PHOSPHATE 10 MG/ML IJ SOLN
INTRAMUSCULAR | Status: AC
  Filled 2017-03-05: qty 1

## 2017-03-05 MED ORDER — PREDNISONE 20 MG PO TABS
ORAL_TABLET | ORAL | Status: AC
  Filled 2017-03-05: qty 3

## 2017-03-05 MED ORDER — PREDNISONE 50 MG PO TABS: ORAL_TABLET | ORAL | 0 refills | Status: DC

## 2017-03-05 NOTE — ED Triage Notes (Addendum)
Reports sinus symptoms:stuffy nose, headaches for a month ago.   Productive cough, clear, white phlegm Daughter is having similar symptoms.   Chest pain for 3 weeks.   Sinus drainage in throat Chills, sweating episodes

## 2017-03-05 NOTE — Discharge Instructions (Signed)
Use the albuterol inhaler 2 puffs every 4 hours as needed for cough, wheeze or chest tightness. He may need to do this for the first 24 hours and then use the inhaler less often and only as needed. Take the prednisone daily as directed. Also recommend he take a daily antihistamine such as Zyrtec or Allegra. If he continues to have persistent allergy symptoms with wheezing flareups follow-up with your primary care doctor, you may need additional medications.

## 2017-03-05 NOTE — ED Provider Notes (Signed)
CSN: [id number]     Arrival date & time 03/05/17  1653 History   First MD Initiated Contact with Patient 03/05/17 1906     Chief Complaint  Patient presents with  . Fatigue  . URI   (Consider location/radiation/quality/duration/timing/severity/associated sxs/prior Treatment) 46 year old obese female states she has had allergies for one month. This includes sinus congestion, runny nose, PND, cough. In the past 3 weeks she is developed tightness across her anterior chest that moves around bilaterally towards the back. Denies pain with movement. Denies history of smoking or asthma.      Past Medical History:  Diagnosis Date  . Amenorrhea   . Arthritis    Bilateral knees  . Heart murmur   . Pituitary adenoma ([place])   . Prolactinoma [facility])    Past Surgical History:  Procedure Laterality Date  . CESAREAN SECTION    . TUBAL LIGATION     Family History  Problem Relation Age of Onset  . Asthma Mother   . Alcohol abuse Father   . Arthritis Father   . Heart disease Father   . Prostate cancer Maternal Grandfather   . Diabetes Maternal Grandfather    Social History  Substance Use Topics  . Smoking status: Never Smoker  . Smokeless tobacco: Never Used  . Alcohol use No   OB History    No data available     Review of Systems  Constitutional: Positive for activity change. Negative for fever.  HENT: Positive for congestion, postnasal drip, rhinorrhea and sinus pressure. Negative for trouble swallowing.   Respiratory: Positive for cough. Negative for shortness of breath.   Cardiovascular:       As per history of present illness  Gastrointestinal: Negative.   Genitourinary: Negative.   Skin: Negative.   All other systems reviewed and are negative.   Allergies  Codeine  Home Medications   Prior to Admission medications   Medication Sig Start Date End Date Taking? Authorizing Provider  albuterol (PROVENTIL HFA;VENTOLIN HFA) 108 (90 Base) MCG/ACT inhaler Inhale 2 puffs into  the lungs every 4 (four) hours as needed for wheezing or shortness of breath. 03/05/17   Janne Napoleon, NP  ibuprofen (ADVIL,MOTRIN) 800 MG tablet Take 1 tablet (800 mg total) by mouth every 8 (eight) hours as needed. 08/12/16   Shelly Bombard, MD  predniSONE (DELTASONE) 50 MG tablet 1 tab po daily for 6 days. Take with food. 03/05/17   Janne Napoleon, NP   Meds Ordered and Administered this Visit   Medications  ipratropium-albuterol (DUONEB) 0.5-2.5 (3) MG/3ML nebulizer solution 3 mL (3 mLs Nebulization Given 03/05/17 1924)  predniSONE (DELTASONE) tablet 60 mg (60 mg Oral Given 03/05/17 1934)    BP (!) 149/90 (BP Location: Left Arm) Comment (BP Location): large cuff  Pulse 73   Temp 98.8 F (37.1 C) (Oral)   Resp 20   SpO2 99%  No data found.   Physical Exam  Constitutional: She is oriented to person, place, and time. She appears well-developed and well-nourished. No distress.  HENT:  Oropharynx with light, minor patchy erythema. No swelling or exudates.  Eyes: EOM are normal.  Neck: Normal range of motion. Neck supple.  Cardiovascular: Normal rate, regular rhythm and normal heart sounds.   Pulmonary/Chest: Effort normal. No respiratory distress.  Bilateral expiratory wheeze. Prolonged expiratory phase and decreased air movement. Certainly a portion of the decrease in air movement may be due to obesity/body habitus.  Musculoskeletal: Normal range of motion. She exhibits no edema.  Lymphadenopathy:    She has no cervical adenopathy.  Neurological: She is alert and oriented to person, place, and time.  Skin: Skin is warm and dry. No rash noted.  Psychiatric: She has a normal mood and affect.  Nursing note and vitals reviewed.   Urgent Care Course     Procedures (including critical care time)  Labs Review Labs Reviewed - No data to display  Imaging Review No results found.   Visual Acuity Review  Right Eye Distance:   Left Eye Distance:   Bilateral Distance:    Right Eye  Near:   Left Eye Near:    Bilateral Near:         MDM   1. Chronic seasonal allergic rhinitis due to pollen   2. Bronchospasm    Post DuoNeb the patient states she is feeling better and the tightness in her chest has significantly improved. She is moving more air and there is decrease in wheeze. She states she is ready to go home. Use the albuterol inhaler 2 puffs every 4 hours as needed for cough, wheeze or chest tightness. He may need to do this for the first 24 hours and then use the inhaler less often and only as needed. Take the prednisone daily as directed. Also recommend he take a daily antihistamine such as Zyrtec or Allegra. If he continues to have persistent allergy symptoms with wheezing flareups follow-up with your primary care doctor, you may need additional medications. Meds ordered this encounter  Medications  . ipratropium-albuterol (DUONEB) 0.5-2.5 (3) MG/3ML nebulizer solution 3 mL  . DISCONTD: dexamethasone (DECADRON) injection 10 mg  . predniSONE (DELTASONE) tablet 60 mg  . albuterol (PROVENTIL HFA;VENTOLIN HFA) 108 (90 Base) MCG/ACT inhaler    Sig: Inhale 2 puffs into the lungs every 4 (four) hours as needed for wheezing or shortness of breath.    Dispense:  1 Inhaler    Refill:  0    Order Specific Question:   Supervising Provider    Answer:   Sherlene Shams [[id number]]  . predniSONE (DELTASONE) 50 MG tablet    Sig: 1 tab po daily for 6 days. Take with food.    Dispense:  6 tablet    Refill:  0    Order Specific Question:   Supervising Provider    Answer:   Sherlene Shams [[id number]]       Janne Napoleon, NP 03/05/17 [phone]

## 2017-03-10 ENCOUNTER — Ambulatory Visit: Payer: 59 | Admitting: Nurse Practitioner

## 2017-03-22 ENCOUNTER — Other Ambulatory Visit (INDEPENDENT_AMBULATORY_CARE_PROVIDER_SITE_OTHER): Payer: BLUE CROSS/BLUE SHIELD

## 2017-03-22 ENCOUNTER — Ambulatory Visit (INDEPENDENT_AMBULATORY_CARE_PROVIDER_SITE_OTHER): Payer: BLUE CROSS/BLUE SHIELD | Admitting: Nurse Practitioner

## 2017-03-22 ENCOUNTER — Encounter: Payer: Self-pay | Admitting: Nurse Practitioner

## 2017-03-22 VITALS — BP 148/92 | HR 65 | Temp 98.1°F | Ht 63.0 in | Wt 239.0 lb

## 2017-03-22 DIAGNOSIS — R030 Elevated blood-pressure reading, without diagnosis of hypertension: Secondary | ICD-10-CM

## 2017-03-22 DIAGNOSIS — J309 Allergic rhinitis, unspecified: Secondary | ICD-10-CM

## 2017-03-22 LAB — BASIC METABOLIC PANEL
BUN: 13 mg/dL (ref 6–23)
CALCIUM: 9.4 mg/dL (ref 8.4–10.5)
CHLORIDE: 104 meq/L (ref 96–112)
CO2: 31 meq/L (ref 19–32)
Creatinine, Ser: 0.86 mg/dL (ref 0.40–1.20)
GFR: 90.68 mL/min (ref >60.00)
GLUCOSE: 108 mg/dL — AB (ref 70–99)
POTASSIUM: 3.9 meq/L (ref 3.5–5.1)
Sodium: 139 mEq/L (ref 135–145)

## 2017-03-22 MED ORDER — SALINE SPRAY 0.65 % NA SOLN: 1.0000 | NASAL | 0 refills | Status: DC | PRN

## 2017-03-22 MED ORDER — GUAIFENESIN ER 600 MG PO TB12: 600.0000 mg | ORAL_TABLET | Freq: Two times a day (BID) | ORAL | 0 refills | Status: DC | PRN

## 2017-03-22 MED ORDER — HYDROCHLOROTHIAZIDE 12.5 MG PO CAPS: 12.5000 mg | ORAL_CAPSULE | Freq: Every day | ORAL | 3 refills | Status: DC

## 2017-03-22 MED ORDER — FEXOFENADINE HCL 180 MG PO TABS: 180.0000 mg | ORAL_TABLET | Freq: Every day | ORAL | 0 refills | Status: DC

## 2017-03-22 MED ORDER — FLUTICASONE PROPIONATE 50 MCG/ACT NA SUSP: 2.0000 | Freq: Every day | NASAL | 3 refills | Status: DC

## 2017-03-22 NOTE — Progress Notes (Signed)
Subjective:  Patient ID: Whitney Gray, female    DOB: [birth year]  Age: 46 y.o. MRN: [id number]  CC: Follow-up (follow up from urgent care/allergy problem effecting breathing/BP high)   Cough  This is a recurrent problem. The current episode started 1 to 4 weeks ago. The problem has been waxing and waning. The problem occurs constantly. The cough is productive of sputum. Associated symptoms include nasal congestion, postnasal drip, rhinorrhea, shortness of breath and wheezing. Pertinent negatives include no chest pain, chills or fever. The symptoms are aggravated by lying down and cold air. She has tried OTC cough suppressant for the symptoms. Her past medical history is significant for bronchitis and environmental allergies.    Outpatient Medications Prior to Visit  Medication Sig Dispense Refill  . albuterol (PROVENTIL HFA;VENTOLIN HFA) 108 (90 Base) MCG/ACT inhaler Inhale 2 puffs into the lungs every 4 (four) hours as needed for wheezing or shortness of breath. 1 Inhaler 0  . ibuprofen (ADVIL,MOTRIN) 800 MG tablet Take 1 tablet (800 mg total) by mouth every 8 (eight) hours as needed. 30 tablet 5  . predniSONE (DELTASONE) 50 MG tablet 1 tab po daily for 6 days. Take with food. (Patient not taking: Reported on 03/22/2017) 6 tablet 0   No facility-administered medications prior to visit.     ROS See HPI  Objective:  BP (!) 148/92   Pulse 65   Temp 98.1 F (36.7 C)   Ht 5\' 3"  (1.6 m)   Wt 239 lb (108.4 kg)   LMP  (LMP Unknown)   SpO2 99%   BMI 42.34 kg/m   BP Readings from Last 3 Encounters:  03/22/17 (!) 148/92  03/05/17 (!) 149/90  08/12/16 128/81    Wt Readings from Last 3 Encounters:  03/22/17 239 lb (108.4 kg)  08/12/16 240 lb 1.6 oz (108.9 kg)  07/10/15 224 lb (101.6 kg)    Physical Exam  Constitutional: She is oriented to person, place, and time.  HENT:  Right Ear: External ear normal. No mastoid tenderness. Tympanic membrane is not injected and not  erythematous. A middle ear effusion is present.  Left Ear: External ear normal. No mastoid tenderness. Tympanic membrane is not injected and not erythematous. A middle ear effusion is present.  Nose: Mucosal edema and rhinorrhea present. Right sinus exhibits no maxillary sinus tenderness and no frontal sinus tenderness. Left sinus exhibits no maxillary sinus tenderness and no frontal sinus tenderness.  Mouth/Throat: Uvula is midline. Posterior oropharyngeal erythema present. No oropharyngeal exudate.  Eyes: No scleral icterus.  Neck: Normal range of motion. Neck supple.  Cardiovascular: Normal rate, regular rhythm and normal heart sounds.   Pulmonary/Chest: Effort normal and breath sounds normal.  Abdominal: Soft. Bowel sounds are normal.  Musculoskeletal: She exhibits edema.  Lymphadenopathy:    She has no cervical adenopathy.  Neurological: She is alert and oriented to person, place, and time.  Skin: Skin is warm and dry.  Vitals reviewed.   Lab Results  Component Value Date   GLUCOSE 108 (H) 03/22/2017   NA 139 03/22/2017   K 3.9 03/22/2017   CL 104 03/22/2017   CREATININE 0.86 03/22/2017   BUN 13 03/22/2017   CO2 31 03/22/2017    No results found.  Assessment & Plan:   Whitney Gray was seen today for follow-up.  Diagnoses and all orders for this visit:  Elevated BP without diagnosis of hypertension -     Basic metabolic panel; Future -     hydrochlorothiazide (MICROZIDE) 12.5 MG  capsule; Take 1 capsule (12.5 mg total) by mouth daily.  Chronic allergic rhinitis, unspecified seasonality, unspecified trigger -     fluticasone (FLONASE) 50 MCG/ACT nasal spray; Place 2 sprays into both nostrils daily. -     sodium chloride (OCEAN) 0.65 % SOLN nasal spray; Place 1 spray into both nostrils as needed for congestion. -     guaiFENesin (MUCINEX) 600 MG 12 hr tablet; Take 1 tablet (600 mg total) by mouth 2 (two) times daily as needed for cough or to loosen phlegm. -     fexofenadine  (ALLEGRA) 180 MG tablet; Take 1 tablet (180 mg total) by mouth at bedtime.   I have discontinued Whitney Gray's predniSONE. I am also having her start on fluticasone, sodium chloride, guaiFENesin, fexofenadine, and hydrochlorothiazide. Additionally, I am having her maintain her ibuprofen and albuterol.  Meds ordered this encounter  Medications  . fluticasone (FLONASE) 50 MCG/ACT nasal spray    Sig: Place 2 sprays into both nostrils daily.    Dispense:  16 g    Refill:  3    Order Specific Question:   Supervising Provider    Answer:   Cassandria Anger [[id number]]  . sodium chloride (OCEAN) 0.65 % SOLN nasal spray    Sig: Place 1 spray into both nostrils as needed for congestion.    Dispense:  15 mL    Refill:  0    Order Specific Question:   Supervising Provider    Answer:   Cassandria Anger [[id number]]  . guaiFENesin (MUCINEX) 600 MG 12 hr tablet    Sig: Take 1 tablet (600 mg total) by mouth 2 (two) times daily as needed for cough or to loosen phlegm.    Dispense:  14 tablet    Refill:  0    Order Specific Question:   Supervising Provider    Answer:   Cassandria Anger [[id number]]  . fexofenadine (ALLEGRA) 180 MG tablet    Sig: Take 1 tablet (180 mg total) by mouth at bedtime.    Dispense:  30 tablet    Refill:  0    Order Specific Question:   Supervising Provider    Answer:   Cassandria Anger [[id number]]  . hydrochlorothiazide (MICROZIDE) 12.5 MG capsule    Sig: Take 1 capsule (12.5 mg total) by mouth daily.    Dispense:  30 capsule    Refill:  3    Order Specific Question:   Supervising Provider    Answer:   Cassandria Anger [[id number]]    Follow-up: Return in about 2 weeks (around 04/05/2017) for elevated BP (with greg calone).  Wilfred Lacy, NP

## 2017-03-22 NOTE — Patient Instructions (Signed)
htn  Hypertension Hypertension, commonly called high blood pressure, is when the force of blood pumping through the arteries is too strong. The arteries are the blood vessels that carry blood from the heart throughout the body. Hypertension forces the heart to work harder to pump blood and may cause arteries to become narrow or stiff. Having untreated or uncontrolled hypertension can cause heart attacks, strokes, kidney disease, and other problems. A blood pressure reading consists of a higher number over a lower number. Ideally, your blood pressure should be below 120/80. The first ("top") number is called the systolic pressure. It is a measure of the pressure in your arteries as your heart beats. The second ("bottom") number is called the diastolic pressure. It is a measure of the pressure in your arteries as the heart relaxes. What are the causes? The cause of this condition is not known. What increases the risk? Some risk factors for high blood pressure are under your control. Others are not. Factors you can change   Smoking.  Having type 2 diabetes mellitus, high cholesterol, or both.  Not getting enough exercise or physical activity.  Being overweight.  Having too much fat, sugar, calories, or salt (sodium) in your diet.  Drinking too much alcohol. Factors that are difficult or impossible to change   Having chronic kidney disease.  Having a family history of high blood pressure.  Age. Risk increases with age.  Race. You may be at higher risk if you are African-American.  Gender. Men are at higher risk than women before age 72. After age 65, women are at higher risk than men.  Having obstructive sleep apnea.  Stress. What are the signs or symptoms? Extremely high blood pressure (hypertensive crisis) may cause:  Headache.  Anxiety.  Shortness of breath.  Nosebleed.  Nausea and vomiting.  Severe chest pain.  Jerky movements you cannot control (seizures). How is  this diagnosed? This condition is diagnosed by measuring your blood pressure while you are seated, with your arm resting on a surface. The cuff of the blood pressure monitor will be placed directly against the skin of your upper arm at the level of your heart. It should be measured at least twice using the same arm. Certain conditions can cause a difference in blood pressure between your right and left arms. Certain factors can cause blood pressure readings to be lower or higher than normal (elevated) for a short period of time:  When your blood pressure is higher when you are in a health care provider's office than when you are at home, this is called white coat hypertension. Most people with this condition do not need medicines.  When your blood pressure is higher at home than when you are in a health care provider's office, this is called masked hypertension. Most people with this condition may need medicines to control blood pressure. If you have a high blood pressure reading during one visit or you have normal blood pressure with other risk factors:  You may be asked to return on a different day to have your blood pressure checked again.  You may be asked to monitor your blood pressure at home for 1 week or longer. If you are diagnosed with hypertension, you may have other blood or imaging tests to help your health care provider understand your overall risk for other conditions. How is this treated? This condition is treated by making healthy lifestyle changes, such as eating healthy foods, exercising more, and reducing your alcohol intake.  Your health care provider may prescribe medicine if lifestyle changes are not enough to get your blood pressure under control, and if:  Your systolic blood pressure is above 130.  Your diastolic blood pressure is above 80. Your personal target blood pressure may vary depending on your medical conditions, your age, and other factors. Follow these  instructions at home: Eating and drinking   Eat a diet that is high in fiber and potassium, and low in sodium, added sugar, and fat. An example eating plan is called the DASH (Dietary Approaches to Stop Hypertension) diet. To eat this way:  Eat plenty of fresh fruits and vegetables. Try to fill half of your plate at each meal with fruits and vegetables.  Eat whole grains, such as whole wheat pasta, brown rice, or whole grain bread. Fill about one quarter of your plate with whole grains.  Eat or drink low-fat dairy products, such as skim milk or low-fat yogurt.  Avoid fatty cuts of meat, processed or cured meats, and poultry with skin. Fill about one quarter of your plate with lean proteins, such as fish, chicken without skin, beans, eggs, and tofu.  Avoid premade and processed foods. These tend to be higher in sodium, added sugar, and fat.  Reduce your daily sodium intake. Most people with hypertension should eat less than 1,500 mg of sodium a day.  Limit alcohol intake to no more than 1 drink a day for nonpregnant women and 2 drinks a day for men. One drink equals 12 oz of beer, 5 oz of wine, or 1 oz of hard liquor. Lifestyle   Work with your health care provider to maintain a healthy body weight or to lose weight. Ask what an ideal weight is for you.  Get at least 30 minutes of exercise that causes your heart to beat faster (aerobic exercise) most days of the week. Activities may include walking, swimming, or biking.  Include exercise to strengthen your muscles (resistance exercise), such as pilates or lifting weights, as part of your weekly exercise routine. Try to do these types of exercises for 30 minutes at least 3 days a week.  Do not use any products that contain nicotine or tobacco, such as cigarettes and e-cigarettes. If you need help quitting, ask your health care provider.  Monitor your blood pressure at home as told by your health care provider.  Keep all follow-up visits  as told by your health care provider. This is important. Medicines   Take over-the-counter and prescription medicines only as told by your health care provider. Follow directions carefully. Blood pressure medicines must be taken as prescribed.  Do not skip doses of blood pressure medicine. Doing this puts you at risk for problems and can make the medicine less effective.  Ask your health care provider about side effects or reactions to medicines that you should watch for. Contact a health care provider if:  You think you are having a reaction to a medicine you are taking.  You have headaches that keep coming back (recurring).  You feel dizzy.  You have swelling in your ankles.  You have trouble with your vision. Get help right away if:  You develop a severe headache or confusion.  You have unusual weakness or numbness.  You feel faint.  You have severe pain in your chest or abdomen.  You vomit repeatedly.  You have trouble breathing. Summary  Hypertension is when the force of blood pumping through your arteries is too strong. If this  condition is not controlled, it may put you at risk for serious complications.  Your personal target blood pressure may vary depending on your medical conditions, your age, and other factors. For most people, a normal blood pressure is less than 120/80.  Hypertension is treated with lifestyle changes, medicines, or a combination of both. Lifestyle changes include weight loss, eating a healthy, low-sodium diet, exercising more, and limiting alcohol. This information is not intended to replace advice given to you by your health care provider. Make sure you discuss any questions you have with your health care provider. Document Released: 11/29/2005 Document Revised: 10/27/2016 Document Reviewed: 10/27/2016 Elsevier Interactive Patient Education  2017 [employer].

## 2017-03-22 NOTE — Progress Notes (Signed)
Pre visit review using our clinic review tool, if applicable. No additional management support is needed unless otherwise documented below in the visit note. 

## 2017-04-05 ENCOUNTER — Ambulatory Visit (INDEPENDENT_AMBULATORY_CARE_PROVIDER_SITE_OTHER): Payer: BLUE CROSS/BLUE SHIELD | Admitting: Family

## 2017-04-05 VITALS — BP 124/82 | HR 72 | Temp 98.5°F | Ht 63.0 in | Wt 244.0 lb

## 2017-04-05 DIAGNOSIS — Z6841 Body Mass Index (BMI) 40.0 and over, adult: Secondary | ICD-10-CM

## 2017-04-05 DIAGNOSIS — I10 Essential (primary) hypertension: Secondary | ICD-10-CM

## 2017-04-05 NOTE — Assessment & Plan Note (Signed)
Blood pressure well-controlled current medication regimen and no adverse side effects. Blood pressures at home remain stable. Denies worst headache of life with no symptoms of end organ damage noted on physical exam. Continue current dosage of hydrochlorothiazide. Continue to monitor blood pressures, follow low-sodium diet.

## 2017-04-05 NOTE — Assessment & Plan Note (Signed)
BMI of 43. Recommend weight loss of 5-10% of current body weight through nutrition and physical activity changes. Recommended nutritional intake that is moderate, balance, and varied. Start calorie tracking with nutrition log or through software/application. Encouraged to make small lifestyle changes to include nutrient dense foods and a nutritional intake that is moderate, balance, and varied. Consider weight loss medication if conservative treatment is unsuccessful.

## 2017-04-05 NOTE — Patient Instructions (Addendum)
Thank you for choosing [facility].  SUMMARY AND INSTRUCTIONS:  Please continue to take your medications as prescribed.  Try the Xyzal at night.  Work on tracking your calories and exercise through MyFitnessPal.  Increase protein intake to of about 88 g of protein    Medication:  Your prescription(s) have been submitted to your pharmacy or been printed and provided for you. Please take as directed and contact our office if you believe you are having problem(s) with the medication(s) or have any questions.  Follow up:  If your symptoms worsen or fail to improve, please contact our office for further instruction, or in case of emergency go directly to the emergency room at the closest medical facility.

## 2017-04-05 NOTE — Progress Notes (Signed)
Pre visit review using our clinic review tool, if applicable. No additional management support is needed unless otherwise documented below in the visit note. 

## 2017-04-05 NOTE — Progress Notes (Signed)
Subjective:    Patient ID: Whitney Gray, female    DOB: January 14, 1971, 46 y.o.   MRN: [id number]  Chief Complaint  Patient presents with  . Follow-up    BP    HPI:  Whitney Gray is a 46 y.o. female who  has a past medical history of Amenorrhea; Arthritis; Heart murmur; Pituitary adenoma ([place]); and Prolactinoma ([place]). and presents today for an office visit.  Currently maintained on hydrochlorothiazide and reports taking the medications as prescribed and denies adverse side effects or hypotensive readings. Blood pressures at home have been well controlled.  Denies changes in vision, worst headache of life or new symptoms of end organ damage. Working on following a low sodium diet.   BP Readings from Last 3 Encounters:  04/05/17 124/82  03/22/17 (!) 148/92  03/05/17 (!) 149/90      Allergies  Allergen Reactions  . Latex Rash  . Codeine Nausea Only    Per pt,nausea,vomiting,dizzy.       Outpatient Medications Prior to Visit  Medication Sig Dispense Refill  . albuterol (PROVENTIL HFA;VENTOLIN HFA) 108 (90 Base) MCG/ACT inhaler Inhale 2 puffs into the lungs every 4 (four) hours as needed for wheezing or shortness of breath. 1 Inhaler 0  . fluticasone (FLONASE) 50 MCG/ACT nasal spray Place 2 sprays into both nostrils daily. 16 g 3  . guaiFENesin (MUCINEX) 600 MG 12 hr tablet Take 1 tablet (600 mg total) by mouth 2 (two) times daily as needed for cough or to loosen phlegm. 14 tablet 0  . hydrochlorothiazide (MICROZIDE) 12.5 MG capsule Take 1 capsule (12.5 mg total) by mouth daily. 30 capsule 3  . ibuprofen (ADVIL,MOTRIN) 800 MG tablet Take 1 tablet (800 mg total) by mouth every 8 (eight) hours as needed. 30 tablet 5  . sodium chloride (OCEAN) 0.65 % SOLN nasal spray Place 1 spray into both nostrils as needed for congestion. 15 mL 0  . fexofenadine (ALLEGRA) 180 MG tablet Take 1 tablet (180 mg total) by mouth at bedtime. 30 tablet 0   No facility-administered medications prior to  visit.     Review of Systems  Constitutional: Negative for chills and fever.  Eyes:       Negative for changes in vision  Respiratory: Negative for cough, chest tightness and wheezing.   Cardiovascular: Negative for chest pain, palpitations and leg swelling.  Neurological: Negative for dizziness, weakness and light-headedness.      Objective:    BP 124/82 (BP Location: Left Arm, Patient Position: Sitting, Cuff Size: Large)   Pulse 72   Temp 98.5 F (36.9 C) (Oral)   Ht 5\' 3"  (1.6 m)   Wt 244 lb (110.7 kg)   LMP  (LMP Unknown)   SpO2 100%   BMI 43.22 kg/m  Nursing note and vital signs reviewed.  Physical Exam  Constitutional: She is oriented to person, place, and time. She appears well-developed and well-nourished. No distress.  Cardiovascular: Normal rate, regular rhythm, normal heart sounds and intact distal pulses.   Pulmonary/Chest: Effort normal and breath sounds normal.  Neurological: She is alert and oriented to person, place, and time.  Skin: Skin is warm and dry.  Psychiatric: She has a normal mood and affect. Her behavior is normal. Judgment and thought content normal.       Assessment & Plan:   Problem List Items Addressed This Visit      Cardiovascular and Mediastinum   Hypertension    Blood pressure well-controlled current medication regimen and no  adverse side effects. Blood pressures at home remain stable. Denies worst headache of life with no symptoms of end organ damage noted on physical exam. Continue current dosage of hydrochlorothiazide. Continue to monitor blood pressures, follow low-sodium diet.        Other   Obesity - Primary    BMI of 43. Recommend weight loss of 5-10% of current body weight through nutrition and physical activity changes. Recommended nutritional intake that is moderate, balance, and varied. Start calorie tracking with nutrition log or through software/application. Encouraged to make small lifestyle changes to include nutrient  dense foods and a nutritional intake that is moderate, balance, and varied. Consider weight loss medication if conservative treatment is unsuccessful.          I have discontinued Ms. Hoos's fexofenadine. I am also having her maintain her ibuprofen, albuterol, fluticasone, sodium chloride, guaiFENesin, and hydrochlorothiazide.   Follow-up: Return in about 1 month (around 05/05/2017), or if symptoms worsen or fail to improve.  Mauricio Po, FNP

## 2017-04-22 ENCOUNTER — Other Ambulatory Visit: Payer: Self-pay | Admitting: *Deleted

## 2017-04-22 DIAGNOSIS — R030 Elevated blood-pressure reading, without diagnosis of hypertension: Secondary | ICD-10-CM

## 2017-04-22 MED ORDER — HYDROCHLOROTHIAZIDE 12.5 MG PO CAPS: 12.5000 mg | ORAL_CAPSULE | Freq: Every day | ORAL | 0 refills | Status: DC

## 2017-04-22 MED ORDER — FLUTICASONE PROPIONATE 50 MCG/ACT NA SUSP: 2.0000 | Freq: Every day | NASAL | 0 refills | Status: DC

## 2017-04-22 MED ORDER — IBUPROFEN 800 MG PO TABS: 800.0000 mg | ORAL_TABLET | Freq: Three times a day (TID) | ORAL | 0 refills | Status: DC | PRN

## 2017-05-18 ENCOUNTER — Telehealth: Payer: Self-pay | Admitting: Family

## 2017-05-18 DIAGNOSIS — Z0283 Encounter for blood-alcohol and blood-drug test: Secondary | ICD-10-CM

## 2017-05-18 NOTE — Telephone Encounter (Signed)
Pt called in and wants a order sent [facility] [place].  She needs a blood test for nicotine  (cotinine test).  This for ins purposes.  If she does not do this test, there husband ins is going to go up $50.00 a week   Best number [phone]

## 2017-05-19 NOTE — Telephone Encounter (Signed)
Please advise 

## 2017-05-20 NOTE — Telephone Encounter (Signed)
Order was placed and to be faxed.

## 2017-05-20 NOTE — Telephone Encounter (Signed)
Pt aware. Wants me to fax it to the [facility] on [facility] in [place]. Fax # [phone].

## 2017-05-23 NOTE — Telephone Encounter (Signed)
Faxed over orders to [facility]

## 2017-06-08 ENCOUNTER — Other Ambulatory Visit: Payer: Self-pay | Admitting: Family

## 2017-06-10 LAB — NICOTINE/COTININE METABOLITES
Cotinine: NOT DETECTED ng/mL
NICOTINE: NOT DETECTED ng/mL

## 2017-09-20 ENCOUNTER — Ambulatory Visit (INDEPENDENT_AMBULATORY_CARE_PROVIDER_SITE_OTHER): Payer: BLUE CROSS/BLUE SHIELD | Admitting: Family

## 2017-09-20 ENCOUNTER — Encounter: Payer: Self-pay | Admitting: Family

## 2017-09-20 ENCOUNTER — Other Ambulatory Visit (INDEPENDENT_AMBULATORY_CARE_PROVIDER_SITE_OTHER): Payer: BLUE CROSS/BLUE SHIELD

## 2017-09-20 DIAGNOSIS — Z0000 Encounter for general adult medical examination without abnormal findings: Secondary | ICD-10-CM

## 2017-09-20 DIAGNOSIS — I10 Essential (primary) hypertension: Secondary | ICD-10-CM | POA: Diagnosis not present

## 2017-09-20 DIAGNOSIS — Z1231 Encounter for screening mammogram for malignant neoplasm of breast: Secondary | ICD-10-CM | POA: Insufficient documentation

## 2017-09-20 LAB — CBC
HCT: 35.2 % — ABNORMAL LOW (ref 36.0–46.0)
Hemoglobin: 11.2 g/dL — ABNORMAL LOW (ref 12.0–15.0)
MCHC: 31.8 g/dL (ref 30.0–36.0)
MCV: 83.3 fl (ref 78.0–100.0)
PLATELETS: 210 10*3/uL (ref 150.0–400.0)
RBC: 4.23 Mil/uL (ref 3.87–5.11)
RDW: 15.2 % (ref 11.5–15.5)
WBC: 6.6 10*3/uL (ref 4.0–10.5)

## 2017-09-20 LAB — IBC PANEL
Iron: 52 ug/dL (ref 42–145)
SATURATION RATIOS: 17 % — AB (ref 20.0–50.0)
TRANSFERRIN: 218 mg/dL (ref 212.0–360.0)

## 2017-09-20 LAB — LIPID PANEL
CHOL/HDL RATIO: 4
Cholesterol: 146 mg/dL (ref 0–200)
HDL: 40.2 mg/dL (ref >39.00)
LDL Cholesterol: 86 mg/dL (ref 0–99)
NONHDL: 105.82
Triglycerides: 100 mg/dL (ref 0.0–149.0)
VLDL: 20 mg/dL (ref 0.0–40.0)

## 2017-09-20 LAB — COMPREHENSIVE METABOLIC PANEL
ALK PHOS: 56 U/L (ref 39–117)
ALT: 10 U/L (ref 0–35)
AST: 13 U/L (ref 0–37)
Albumin: 4.3 g/dL (ref 3.5–5.2)
BILIRUBIN TOTAL: 0.6 mg/dL (ref 0.2–1.2)
BUN: 9 mg/dL (ref 6–23)
CHLORIDE: 102 meq/L (ref 96–112)
CO2: 31 meq/L (ref 19–32)
Calcium: 9.7 mg/dL (ref 8.4–10.5)
Creatinine, Ser: 0.86 mg/dL (ref 0.40–1.20)
GFR: 90.49 mL/min (ref >60.00)
Glucose, Bld: 90 mg/dL (ref 70–99)
POTASSIUM: 3.9 meq/L (ref 3.5–5.1)
Sodium: 139 mEq/L (ref 135–145)
Total Protein: 7.5 g/dL (ref 6.0–8.3)

## 2017-09-20 NOTE — Assessment & Plan Note (Signed)
1) Anticipatory Guidance: Discussed importance of wearing a seatbelt while driving and not texting while driving; changing batteries in smoke detector at least once annually; wearing suntan lotion when outside; eating a balanced and moderate diet; getting physical activity at least 30 minutes per day.  2) Immunizations / Screenings / Labs:  Declines influenza and tetanus. All other immunizations are up-to-date per recommendations. Due for a vision exam encouraged to be completed independently. Cervical cancer screening is up-to-date per recommendations. All other screenings are up-to-date per recommendations. Obtain CBC, CMET, and lipid profile.    Overall well exam with risk factors for cardiovascular disease including hypertension and obesity. She has recently lost 20 pounds and is currently following an intermittent fasting program. She is slowly working on increasing her physical activity. We discussed taking a drug holiday from her hydrochlorothiazide and monitoring blood pressure to see if there is a continued need given most recent weight loss. Continue other healthy lifestyle behaviors and choices. Follow-up prevention exam in 1 year. Follow-up office visit pending blood work and for chronic conditions.

## 2017-09-20 NOTE — Patient Instructions (Addendum)
Thank you for choosing [facility].  SUMMARY AND INSTRUCTIONS:  Please hold your blood pressure medication for 1 week and continue to monitor your blood pressure  Great with the weight loss!  Labs:  Please stop by the lab on the lower level of the building for your blood work. Your results will be released to [place] (or called to you) after review, usually within 72 hours after test completion. If any changes need to be made, you will be notified at that same time.  1.) The lab is open from 7:30am to 5:30 pm Monday-Friday 2.) No appointment is necessary 3.) Fasting (if needed) is 6-8 hours after food and drink; black coffee and water are okay   Follow up:  If your symptoms worsen or fail to improve, please contact our office for further instruction, or in case of emergency go directly to the emergency room at the closest medical facility.    Health Maintenance, Female Adopting a healthy lifestyle and getting preventive care can go a long way to promote health and wellness. Talk with your health care provider about what schedule of regular examinations is right for you. This is a good chance for you to check in with your provider about disease prevention and staying healthy. In between checkups, there are plenty of things you can do on your own. Experts have done a lot of research about which lifestyle changes and preventive measures are most likely to keep you healthy. Ask your health care provider for more information. Weight and diet Eat a healthy diet  Be sure to include plenty of vegetables, fruits, low-fat dairy products, and lean protein.  Do not eat a lot of foods high in solid fats, added sugars, or salt.  Get regular exercise. This is one of the most important things you can do for your health. ? Most adults should exercise for at least 150 minutes each week. The exercise should increase your heart rate and make you sweat (moderate-intensity exercise). ? Most adults  should also do strengthening exercises at least twice a week. This is in addition to the moderate-intensity exercise.  Maintain a healthy weight  Body mass index (BMI) is a measurement that can be used to identify possible weight problems. It estimates body fat based on height and weight. Your health care provider can help determine your BMI and help you achieve or maintain a healthy weight.  For females 48 years of age and older: ? A BMI below 18.5 is considered underweight. ? A BMI of 18.5 to 24.9 is normal. ? A BMI of 25 to 29.9 is considered overweight. ? A BMI of 30 and above is considered obese.  Watch levels of cholesterol and blood lipids  You should start having your blood tested for lipids and cholesterol at 48 years of age, then have this test every 5 years.  You may need to have your cholesterol levels checked more often if: ? Your lipid or cholesterol levels are high. ? You are older than 48 years of age. ? You are at high risk for heart disease.  Cancer screening Lung Cancer  Lung cancer screening is recommended for adults 48-76 years old who are at high risk for lung cancer because of a history of smoking.  A yearly low-dose CT scan of the lungs is recommended for people who: ? Currently smoke. ? Have quit within the past 15 years. ? Have at least a 30-pack-year history of smoking. A pack year is smoking an average of  one pack of cigarettes a day for 1 year.  Yearly screening should continue until it has been 15 years since you quit.  Yearly screening should stop if you develop a health problem that would prevent you from having lung cancer treatment.  Breast Cancer  Practice breast self-awareness. This means understanding how your breasts normally appear and feel.  It also means doing regular breast self-exams. Let your health care provider know about any changes, no matter how small.  If you are in your 48s, you should have a clinical breast exam (CBE)  by a health care provider every 1-3 years as part of a regular health exam.  If you are 70 or older, have a CBE every year. Also consider having a breast X-ray (mammogram) every year.  If you have a family history of breast cancer, talk to your health care provider about genetic screening.  If you are at high risk for breast cancer, talk to your health care provider about having an MRI and a mammogram every year.  Breast cancer gene (BRCA) assessment is recommended for women who have family members with BRCA-related cancers. BRCA-related cancers include: ? Breast. ? Ovarian. ? Tubal. ? Peritoneal cancers.  Results of the assessment will determine the need for genetic counseling and BRCA1 and BRCA2 testing.  Cervical Cancer Your health care provider may recommend that you be screened regularly for cancer of the pelvic organs (ovaries, uterus, and vagina). This screening involves a pelvic examination, including checking for microscopic changes to the surface of your cervix (Pap test). You may be encouraged to have this screening done every 3 years, beginning at age 48.  For women ages 20-65, health care providers may recommend pelvic exams and Pap testing every 3 years, or they may recommend the Pap and pelvic exam, combined with testing for human papilloma virus (HPV), every 5 years. Some types of HPV increase your risk of cervical cancer. Testing for HPV may also be done on women of any age with unclear Pap test results.  Other health care providers may not recommend any screening for nonpregnant women who are considered low risk for pelvic cancer and who do not have symptoms. Ask your health care provider if a screening pelvic exam is right for you.  If you have had past treatment for cervical cancer or a condition that could lead to cancer, you need Pap tests and screening for cancer for at least 20 years after your treatment. If Pap tests have been discontinued, your risk factors (such as  having a new sexual partner) need to be reassessed to determine if screening should resume. Some women have medical problems that increase the chance of getting cervical cancer. In these cases, your health care provider may recommend more frequent screening and Pap tests.  Colorectal Cancer  This type of cancer can be detected and often prevented.  Routine colorectal cancer screening usually begins at 48 years of age and continues through 48 years of age.  Your health care provider may recommend screening at an earlier age if you have risk factors for colon cancer.  Your health care provider may also recommend using home test kits to check for hidden blood in the stool.  A small camera at the end of a tube can be used to examine your colon directly (sigmoidoscopy or colonoscopy). This is done to check for the earliest forms of colorectal cancer.  Routine screening usually begins at age 57.  Direct examination of the colon should  be repeated every 5-10 years through 48 years of age. However, you may need to be screened more often if early forms of precancerous polyps or small growths are found.  Skin Cancer  Check your skin from head to toe regularly.  Tell your health care provider about any new moles or changes in moles, especially if there is a change in a mole's shape or color.  Also tell your health care provider if you have a mole that is larger than the size of a pencil eraser.  Always use sunscreen. Apply sunscreen liberally and repeatedly throughout the day.  Protect yourself by wearing long sleeves, pants, a wide-brimmed hat, and sunglasses whenever you are outside.  Heart disease, diabetes, and high blood pressure  High blood pressure causes heart disease and increases the risk of stroke. High blood pressure is more likely to develop in: ? People who have blood pressure in the high end of the normal range (130-139/85-89 mm Hg). ? People who are overweight or  obese. ? People who are African American.  If you are 54-58 years of age, have your blood pressure checked every 3-5 years. If you are 59 years of age or older, have your blood pressure checked every year. You should have your blood pressure measured twice-once when you are at a hospital or clinic, and once when you are not at a hospital or clinic. Record the average of the two measurements. To check your blood pressure when you are not at a hospital or clinic, you can use: ? An automated blood pressure machine at a pharmacy. ? A home blood pressure monitor.  If you are between 80 years and 41 years old, ask your health care provider if you should take aspirin to prevent strokes.  Have regular diabetes screenings. This involves taking a blood sample to check your fasting blood sugar level. ? If you are at a normal weight and have a low risk for diabetes, have this test once every three years after 48 years of age. ? If you are overweight and have a high risk for diabetes, consider being tested at a younger age or more often. Preventing infection Hepatitis B  If you have a higher risk for hepatitis B, you should be screened for this virus. You are considered at high risk for hepatitis B if: ? You were born in a country where hepatitis B is common. Ask your health care provider which countries are considered high risk. ? Your parents were born in a high-risk country, and you have not been immunized against hepatitis B (hepatitis B vaccine). ? You have HIV or AIDS. ? You use needles to inject street drugs. ? You live with someone who has hepatitis B. ? You have had sex with someone who has hepatitis B. ? You get hemodialysis treatment. ? You take certain medicines for conditions, including cancer, organ transplantation, and autoimmune conditions.  Hepatitis C  Blood testing is recommended for: ? Everyone born from [birth year] through 1967. ? Anyone with known risk factors for hepatitis  C.  Sexually transmitted infections (STIs)  You should be screened for sexually transmitted infections (STIs) including gonorrhea and chlamydia if: ? You are sexually active and are younger than 48 years of age. ? You are older than 49 years of age and your health care provider tells you that you are at risk for this type of infection. ? Your sexual activity has changed since you were last screened and you are at an increased risk  for chlamydia or gonorrhea. Ask your health care provider if you are at risk.  If you do not have HIV, but are at risk, it may be recommended that you take a prescription medicine daily to prevent HIV infection. This is called pre-exposure prophylaxis (PrEP). You are considered at risk if: ? You are sexually active and do not regularly use condoms or know the HIV status of your partner(s). ? You take drugs by injection. ? You are sexually active with a partner who has HIV.  Talk with your health care provider about whether you are at high risk of being infected with HIV. If you choose to begin PrEP, you should first be tested for HIV. You should then be tested every 3 months for as long as you are taking PrEP. Pregnancy  If you are premenopausal and you may become pregnant, ask your health care provider about preconception counseling.  If you may become pregnant, take 400 to 800 micrograms (mcg) of folic acid every day.  If you want to prevent pregnancy, talk to your health care provider about birth control (contraception). Osteoporosis and menopause  Osteoporosis is a disease in which the bones lose minerals and strength with aging. This can result in serious bone fractures. Your risk for osteoporosis can be identified using a bone density scan.  If you are 57 years of age or older, or if you are at risk for osteoporosis and fractures, ask your health care provider if you should be screened.  Ask your health care provider whether you should take a calcium or  vitamin D supplement to lower your risk for osteoporosis.  Menopause may have certain physical symptoms and risks.  Hormone replacement therapy may reduce some of these symptoms and risks. Talk to your health care provider about whether hormone replacement therapy is right for you. Follow these instructions at home:  Schedule regular health, dental, and eye exams.  Stay current with your immunizations.  Do not use any tobacco products including cigarettes, chewing tobacco, or electronic cigarettes.  If you are pregnant, do not drink alcohol.  If you are breastfeeding, limit how much and how often you drink alcohol.  Limit alcohol intake to no more than 1 drink per day for nonpregnant women. One drink equals 12 ounces of beer, 5 ounces of wine, or 1 ounces of hard liquor.  Do not use street drugs.  Do not share needles.  Ask your health care provider for help if you need support or information about quitting drugs.  Tell your health care provider if you often feel depressed.  Tell your health care provider if you have ever been abused or do not feel safe at home. This information is not intended to replace advice given to you by your health care provider. Make sure you discuss any questions you have with your health care provider. Document Released: 06/14/2011 Document Revised: 05/06/2016 Document Reviewed: 09/02/2015 Elsevier Interactive Patient Education  [employer].

## 2017-09-20 NOTE — Progress Notes (Signed)
Subjective:    Patient ID: Whitney Gray, female    DOB: 1971-09-22, 46 y.o.   MRN: [id number]  Chief Complaint  Patient presents with  . CPE    fasting    HPI:  Whitney Gray is a 46 y.o. female who presents today for an annual wellness visit.   1) Health Maintenance -   Diet - Averages about 2-3 meals per day consisting of a regular diet working on intermittent fasting;  No caffeine intake  Exercise - No structured exercise   Wt Readings from Last 3 Encounters:  09/20/17 226 lb 6.4 oz (102.7 kg)  04/05/17 244 lb (110.7 kg)  03/22/17 239 lb (108.4 kg)    2) Preventative Exams / Immunizations:  Dental -- Up to date  Vision -- Due for exam    Health Maintenance  Topic Date Due  . HIV Screening  07/02/1984  . TETANUS/TDAP  07/02/1988  . INFLUENZA VACCINE  07/13/2017  . PAP SMEAR  08/13/2019     There is no immunization history on file for this patient.   Allergies  Allergen Reactions  . Latex Rash  . Codeine Nausea Only    Per pt,nausea,vomiting,dizzy.      Outpatient Medications Prior to Visit  Medication Sig Dispense Refill  . fluticasone (FLONASE) 50 MCG/ACT nasal spray Place 2 sprays into both nostrils daily. 48 g 0  . guaiFENesin (MUCINEX) 600 MG 12 hr tablet Take 1 tablet (600 mg total) by mouth 2 (two) times daily as needed for cough or to loosen phlegm. 14 tablet 0  . hydrochlorothiazide (MICROZIDE) 12.5 MG capsule Take 1 capsule (12.5 mg total) by mouth daily. 90 capsule 0  . ibuprofen (ADVIL,MOTRIN) 800 MG tablet Take 1 tablet (800 mg total) by mouth every 8 (eight) hours as needed. 90 tablet 0  . sodium chloride (OCEAN) 0.65 % SOLN nasal spray Place 1 spray into both nostrils as needed for congestion. 15 mL 0  . albuterol (PROVENTIL HFA;VENTOLIN HFA) 108 (90 Base) MCG/ACT inhaler Inhale 2 puffs into the lungs every 4 (four) hours as needed for wheezing or shortness of breath. 1 Inhaler 0   No facility-administered medications prior to visit.       Past Medical History:  Diagnosis Date  . Amenorrhea   . Arthritis    Bilateral knees  . Heart murmur   . Pituitary adenoma ([place])   . Prolactinoma [facility]      Past Surgical History:  Procedure Laterality Date  . CESAREAN SECTION    . TUBAL LIGATION       Family History  Problem Relation Age of Onset  . Asthma Mother   . Alcohol abuse Father   . Arthritis Father   . Heart disease Father   . Prostate cancer Maternal Grandfather   . Diabetes Maternal Grandfather      Social History   Social History  . Marital status: Married    Spouse name: N/A  . Number of children: 2  . Years of education: 73   Occupational History  . [employer]    Social History Main Topics  . Smoking status: Never Smoker  . Smokeless tobacco: Never Used  . Alcohol use No  . Drug use: No  . Sexual activity: Yes    Partners: Male    Birth control/ protection: None   Other Topics Concern  . Not on file   Social History Narrative   Fun: Play games   Denies abuse and feels safe at  home.   Denies religious beliefs effecting health care.       Review of Systems  Constitutional: Denies fever, chills, fatigue, or significant weight gain/loss. HENT: Head: Denies headache or neck pain Ears: Denies changes in hearing, ringing in ears, earache, drainage Nose: Denies discharge, stuffiness, itching, nosebleed, sinus pain Throat: Denies sore throat, hoarseness, dry mouth, sores, thrush Eyes: Denies loss/changes in vision, pain, redness, blurry/double vision, flashing lights Cardiovascular: Denies chest pain/discomfort, tightness, palpitations, shortness of breath with activity, difficulty lying down, swelling, sudden awakening with shortness of breath Respiratory: Denies shortness of breath, cough, sputum production, wheezing Gastrointestinal: Denies dysphasia, heartburn, change in appetite, nausea, change in bowel habits, rectal bleeding, constipation, diarrhea, yellow skin or  eyes Genitourinary: Denies frequency, urgency, burning/pain, blood in urine, incontinence, change in urinary strength. Musculoskeletal: Denies muscle/joint pain, stiffness, back pain, redness or swelling of joints, trauma Skin: Denies rashes, lumps, itching, dryness, color changes, or hair/nail changes Neurological: Denies dizziness, fainting, seizures, weakness, numbness, tingling, tremor Psychiatric - Denies nervousness, stress, depression or memory loss Endocrine: Denies heat or cold intolerance, sweating, frequent urination, excessive thirst, changes in appetite Hematologic: Denies ease of bruising or bleeding     Objective:     BP 126/78 (BP Location: Left Arm, Patient Position: Sitting, Cuff Size: Large)   Pulse 72   Temp 98.7 F (37.1 C) (Oral)   Resp 16   Ht 5\' 3"  (1.6 m)   Wt 226 lb 6.4 oz (102.7 kg)   LMP  (LMP Unknown)   SpO2 97%   BMI 40.10 kg/m  Nursing note and vital signs reviewed.  Physical Exam  Constitutional: She is oriented to person, place, and time. She appears well-developed and well-nourished.  HENT:  Head: Normocephalic.  Right Ear: Hearing, tympanic membrane, external ear and ear canal normal.  Left Ear: Hearing, tympanic membrane, external ear and ear canal normal.  Nose: Nose normal.  Mouth/Throat: Uvula is midline, oropharynx is clear and moist and mucous membranes are normal.  Eyes: Pupils are equal, round, and reactive to light. Conjunctivae and EOM are normal.  Neck: Neck supple. No JVD present. No tracheal deviation present. No thyromegaly present.  Cardiovascular: Normal rate, regular rhythm, normal heart sounds and intact distal pulses.   Pulmonary/Chest: Effort normal and breath sounds normal.  Abdominal: Soft. Bowel sounds are normal. She exhibits no distension and no mass. There is no tenderness. There is no rebound and no guarding.  Musculoskeletal: Normal range of motion. She exhibits no edema or tenderness.  Lymphadenopathy:    She has  no cervical adenopathy.  Neurological: She is alert and oriented to person, place, and time. She has normal reflexes. No cranial nerve deficit. She exhibits normal muscle tone. Coordination normal.  Skin: Skin is warm and dry.  Psychiatric: She has a normal mood and affect. Her behavior is normal. Judgment and thought content normal.       Assessment & Plan:   Problem List Items Addressed This Visit      Cardiovascular and Mediastinum   Hypertension    Blood pressure well-controlled current medication regimen and no adverse side effects. Discussed drug holiday from hydrochlorothiazide with continuation of monitoring of blood pressure given most recent weight loss of 20 pounds in likely to continue to lose weight with current nutritional regimen.        Other   Routine adult health maintenance    1) Anticipatory Guidance: Discussed importance of wearing a seatbelt while driving and not texting while driving; changing batteries  in smoke detector at least once annually; wearing suntan lotion when outside; eating a balanced and moderate diet; getting physical activity at least 30 minutes per day.  2) Immunizations / Screenings / Labs:  Declines influenza and tetanus. All other immunizations are up-to-date per recommendations. Due for a vision exam encouraged to be completed independently. Cervical cancer screening is up-to-date per recommendations. All other screenings are up-to-date per recommendations. Obtain CBC, CMET, and lipid profile.    Overall well exam with risk factors for cardiovascular disease including hypertension and obesity. She has recently lost 20 pounds and is currently following an intermittent fasting program. She is slowly working on increasing her physical activity. We discussed taking a drug holiday from her hydrochlorothiazide and monitoring blood pressure to see if there is a continued need given most recent weight loss. Continue other healthy lifestyle behaviors and  choices. Follow-up prevention exam in 1 year. Follow-up office visit pending blood work and for chronic conditions.       Relevant Orders   Comprehensive metabolic panel (Completed)   CBC (Completed)   Lipid panel (Completed)   IBC panel (Completed)       I have discontinued Ms. Macmullen's albuterol. I am also having her maintain her sodium chloride, guaiFENesin, fluticasone, hydrochlorothiazide, and ibuprofen.   Follow-up: Return in about 6 months (around 03/21/2018), or if symptoms worsen or fail to improve.   Mauricio Po, FNP

## 2017-09-20 NOTE — Assessment & Plan Note (Signed)
Blood pressure well-controlled current medication regimen and no adverse side effects. Discussed drug holiday from hydrochlorothiazide with continuation of monitoring of blood pressure given most recent weight loss of 20 pounds in likely to continue to lose weight with current nutritional regimen.

## 2018-07-18 DIAGNOSIS — I83813 Varicose veins of bilateral lower extremities with pain: Secondary | ICD-10-CM | POA: Diagnosis not present

## 2018-07-18 DIAGNOSIS — I8311 Varicose veins of right lower extremity with inflammation: Secondary | ICD-10-CM | POA: Diagnosis not present

## 2018-07-18 DIAGNOSIS — I8312 Varicose veins of left lower extremity with inflammation: Secondary | ICD-10-CM | POA: Diagnosis not present

## 2018-07-18 DIAGNOSIS — I83893 Varicose veins of bilateral lower extremities with other complications: Secondary | ICD-10-CM | POA: Diagnosis not present

## 2019-12-21 DIAGNOSIS — M7989 Other specified soft tissue disorders: Secondary | ICD-10-CM | POA: Diagnosis not present

## 2019-12-21 DIAGNOSIS — I83813 Varicose veins of bilateral lower extremities with pain: Secondary | ICD-10-CM | POA: Diagnosis not present

## 2020-02-01 DIAGNOSIS — M7989 Other specified soft tissue disorders: Secondary | ICD-10-CM | POA: Diagnosis not present

## 2020-02-01 DIAGNOSIS — I83813 Varicose veins of bilateral lower extremities with pain: Secondary | ICD-10-CM | POA: Diagnosis not present

## 2020-03-03 ENCOUNTER — Ambulatory Visit (HOSPITAL_COMMUNITY)
Admission: EM | Admit: 2020-03-03 | Discharge: 2020-03-03 | Disposition: A | Discharge status: Home / Self Care | Payer: BC Managed Care – PPO | Attending: Family Medicine | Admitting: Family Medicine

## 2020-03-03 ENCOUNTER — Ambulatory Visit (INDEPENDENT_AMBULATORY_CARE_PROVIDER_SITE_OTHER): Payer: BC Managed Care – PPO

## 2020-03-03 ENCOUNTER — Ambulatory Visit: Payer: BC Managed Care – PPO | Attending: Internal Medicine

## 2020-03-03 ENCOUNTER — Other Ambulatory Visit: Payer: Self-pay

## 2020-03-03 ENCOUNTER — Encounter (HOSPITAL_COMMUNITY): Payer: Self-pay | Admitting: Family Medicine

## 2020-03-03 DIAGNOSIS — R0789 Other chest pain: Secondary | ICD-10-CM

## 2020-03-03 DIAGNOSIS — R079 Chest pain, unspecified: Secondary | ICD-10-CM | POA: Diagnosis not present

## 2020-03-03 MED ORDER — PREDNISONE 20 MG PO TABS: ORAL_TABLET | ORAL | 0 refills | Status: DC

## 2020-03-03 NOTE — ED Provider Notes (Signed)
[place]    CSN: [id number] Arrival date & time: 03/03/20  1515      History   Chief Complaint Chief Complaint  Patient presents with  . Chest Pain  . Back Pain    HPI Whitney Gray is a 49 y.o. female.   49 yo woman who is an established [facility] patient with chest pains. The right chest started hurting Thursday.  Pain did radiate to back and then left chest.  Saw some improvement with inhaler.  Has been coughing more over the weekend.  Most of pain is in the back.  No fever or shortness of breath.  Tested positive for Covid last month.  Patient has a h/o pituitary adenoma with prolactinoma.   S/P BTL  Patient involved in patient care.     Past Medical History:  Diagnosis Date  . Amenorrhea   . Arthritis    Bilateral knees  . Heart murmur   . Pituitary adenoma ([place])   . Prolactinoma [facility])     Patient Active Problem List   Diagnosis Date Noted  . Routine adult health maintenance 09/20/2017  . Hypertension 04/05/2017  . Dandruff 07/10/2015  . Seasonal allergies 07/10/2015  . Pituitary adenoma ([place]) 07/10/2015  . Obesity 07/10/2015    Past Surgical History:  Procedure Laterality Date  . CESAREAN SECTION    . TUBAL LIGATION      OB History   No obstetric history on file.      Home Medications    Prior to Admission medications   Medication Sig Start Date End Date Taking? Authorizing Provider  ALBUTEROL IN Inhale into the lungs.   Yes [provider]  ibuprofen (ADVIL,MOTRIN) 800 MG tablet Take 1 tablet (800 mg total) by mouth every 8 (eight) hours as needed. 04/22/17   Golden Circle, FNP  predniSONE (DELTASONE) 20 MG tablet one daily with food 03/03/20   Robyn Haber, MD  sodium chloride (OCEAN) 0.65 % SOLN nasal spray Place 1 spray into both nostrils as needed for congestion. 03/22/17   Nche, Charlene Brooke, NP  fluticasone (FLONASE) 50 MCG/ACT nasal spray Place 2 sprays into both nostrils daily. 04/22/17 03/03/20  Golden Circle, FNP  hydrochlorothiazide (MICROZIDE) 12.5 MG capsule Take 1 capsule (12.5 mg total) by mouth daily. 04/22/17 03/03/20  Golden Circle, FNP    Family History Family History  Problem Relation Age of Onset  . Asthma Mother   . Alcohol abuse Father   . Arthritis Father   . Heart disease Father   . Prostate cancer Maternal Grandfather   . Diabetes Maternal Grandfather     Social History Social History   Tobacco Use  . Smoking status: Never Smoker  . Smokeless tobacco: Never Used  Substance Use Topics  . Alcohol use: No    Alcohol/week: 0.0 standard drinks  . Drug use: No     Allergies   Latex and Codeine   Review of Systems Review of Systems  Constitutional: Negative.   Respiratory: Positive for cough.   Cardiovascular: Positive for chest pain.  Musculoskeletal: Positive for back pain.  All other systems reviewed and are negative.    Physical Exam Triage Vital Signs ED Triage Vitals  Enc Vitals Group     BP      Pulse      Resp      Temp      Temp src      SpO2      Weight  Height      Head Circumference      Peak Flow      Pain Score      Pain Loc      Pain Edu?      Excl. in [place]?    No data found.  Updated Vital Signs BP (!) 186/83 (BP Location: Right Arm)   Pulse 77   Temp 98.8 F (37.1 C) (Oral)   Resp (!) 22   LMP  (LMP Unknown)   SpO2 100%    Physical Exam Vitals and nursing note reviewed.  Constitutional:      General: She is not in acute distress.    Appearance: She is well-developed. She is obese. She is not ill-appearing or toxic-appearing.  HENT:     Head: Normocephalic.  Cardiovascular:     Rate and Rhythm: Normal rate and regular rhythm.     Heart sounds: Normal heart sounds.  Pulmonary:     Breath sounds: Normal breath sounds.  Chest:     Chest wall: No deformity or tenderness.  Musculoskeletal:        General: Normal range of motion.     Cervical back: Normal range of motion and neck supple.  Skin:     General: Skin is warm and dry.  Neurological:     General: No focal deficit present.     Mental Status: She is alert.      UC Treatments / Results   Radiology Normal 2V chest film    Initial Impression / Assessment and Plan / UC Course  I have reviewed the triage vital signs and the nursing notes.  Pertinent labs & imaging results that were available during my care of the patient were reviewed by me and considered in my medical decision making (see chart for details).    Final Clinical Impressions(s) / UC Diagnoses   Final diagnoses:  Atypical chest pain     Discharge Instructions     The chest X-ray is normal  I believe this pain is from inflammation where the ribs meet the spine.  Expect improvement in 2 days    ED Prescriptions    Medication Sig Dispense Auth. Provider   predniSONE (DELTASONE) 20 MG tablet one daily with food 5 tablet Robyn Haber, MD     I have reviewed the PDMP during this encounter.   Robyn Haber, MD 03/03/20 [phone]

## 2020-03-03 NOTE — Discharge Instructions (Addendum)
The chest X-ray is normal  I believe this pain is from inflammation where the ribs meet the spine.  Expect improvement in 2 days

## 2020-03-03 NOTE — ED Notes (Signed)
Dr Joseph Art at bedside

## 2020-03-03 NOTE — ED Triage Notes (Addendum)
chest pain on right chest started Thursday.  Pain did radiate to back and then left chest.  Saw some improvement with inhaler.    Tested positive for covid in February 2021

## 2020-03-05 ENCOUNTER — Telehealth: Payer: Self-pay | Admitting: Internal Medicine

## 2020-03-05 ENCOUNTER — Ambulatory Visit: Payer: BC Managed Care – PPO | Admitting: Nurse Practitioner

## 2020-03-05 NOTE — Telephone Encounter (Signed)
She would fail COVID screening and as such cannot be scheduled in office. If active CP would recommend ER. Otherwise respiratory clinic until her symptoms have resolved and she can establish with me in the office.

## 2020-03-05 NOTE — Telephone Encounter (Signed)
Pt called back and said she was on hold with triage nurse and called [place] back. She explained that the first person she spoke with told her that she could be worked in today and she was on the way to our office. I explained that she possibly misunderstood and went over the first message that was put in. I then advised pt of Dr. Jerilee Hoh note and that she would want to go to a UC of ED. Also offered to transfer back to triage nurse.

## 2020-03-05 NOTE — Telephone Encounter (Signed)
Noted  

## 2020-03-05 NOTE — Telephone Encounter (Signed)
Pt would like to be established with Jerilee Hoh, however she is trying to be seen today. She has been having chest pains and tightness along with back pain since last Thursday 3/18.She went to [facility] this past Monday and they did a chest x-ray and she does not have pneumonia or bronchitis but they prescribe her a 5 day steroid and it did ease it down but she is still have chest pains. She stated that she is congested but says it is from her allergies. She wants to be seen in office.   Transferred to Nurse Triage due to symptoms.   Pt can be reached at [phone]

## 2020-03-05 NOTE — Telephone Encounter (Signed)
FYI

## 2020-03-24 ENCOUNTER — Encounter: Payer: Self-pay | Admitting: Nurse Practitioner

## 2020-03-24 ENCOUNTER — Other Ambulatory Visit: Payer: Self-pay

## 2020-03-24 ENCOUNTER — Ambulatory Visit: Payer: BC Managed Care – PPO | Admitting: Nurse Practitioner

## 2020-03-24 VITALS — BP 146/92 | HR 78 | Temp 98.0°F | Resp 16 | Ht 63.0 in | Wt 241.0 lb

## 2020-03-24 DIAGNOSIS — J302 Other seasonal allergic rhinitis: Secondary | ICD-10-CM | POA: Diagnosis not present

## 2020-03-24 DIAGNOSIS — I10 Essential (primary) hypertension: Secondary | ICD-10-CM | POA: Diagnosis not present

## 2020-03-24 DIAGNOSIS — F329 Major depressive disorder, single episode, unspecified: Secondary | ICD-10-CM

## 2020-03-24 DIAGNOSIS — F32A Depression, unspecified: Secondary | ICD-10-CM

## 2020-03-24 DIAGNOSIS — D352 Benign neoplasm of pituitary gland: Secondary | ICD-10-CM

## 2020-03-24 DIAGNOSIS — F419 Anxiety disorder, unspecified: Secondary | ICD-10-CM

## 2020-03-24 DIAGNOSIS — F418 Other specified anxiety disorders: Secondary | ICD-10-CM

## 2020-03-24 MED ORDER — HYDROCHLOROTHIAZIDE 12.5 MG PO CAPS: 12.5000 mg | ORAL_CAPSULE | Freq: Every day | ORAL | 0 refills | Status: DC

## 2020-03-24 MED ORDER — FLUTICASONE PROPIONATE 50 MCG/ACT NA SUSP: 2.0000 | Freq: Every day | NASAL | 3 refills | Status: DC

## 2020-03-24 NOTE — Patient Instructions (Addendum)
It was nice to meet you today.     Begin HCTZ 12.5 mg for your BP and record BP daily and bring in record.   STOP the Afrin.   Begin Flonase nasal spray as directed.   Please go to the lab tomorrow- [employer] employee   Do not drink sweetened drinks.   Referral to counseling with a female provider placed.   EXPRESSIONS: in [place] for support stockings 20-30 mmHg as recommended by vein doctor - thigh high.  See me back in 2 weeks for lab and BP check on the HCTZ    DASH Eating Plan DASH stands for "Dietary Approaches to Stop Hypertension." The DASH eating plan is a healthy eating plan that has been shown to reduce high blood pressure (hypertension). It may also reduce your risk for type 2 diabetes, heart disease, and stroke. The DASH eating plan may also help with weight loss. What are tips for following this plan?  General guidelines  Avoid eating more than 2,300 mg (milligrams) of salt (sodium) a day. If you have hypertension, you may need to reduce your sodium intake to 1,500 mg a day.  Limit alcohol intake to no more than 1 drink a day for nonpregnant women and 2 drinks a day for men. One drink equals 12 oz of beer, 5 oz of wine, or 1 oz of hard liquor.  Work with your health care provider to maintain a healthy body weight or to lose weight. Ask what an ideal weight is for you.  Get at least 30 minutes of exercise that causes your heart to beat faster (aerobic exercise) most days of the week. Activities may include walking, swimming, or biking.  Work with your health care provider or diet and nutrition specialist (dietitian) to adjust your eating plan to your individual calorie needs. Reading food labels   Check food labels for the amount of sodium per serving. Choose foods with less than 5 percent of the Daily Value of sodium. Generally, foods with less than 300 mg of sodium per serving fit into this eating plan.  To find whole grains, look for the word "whole" as  the first word in the ingredient list. Shopping  Buy products labeled as "low-sodium" or "no salt added."  Buy fresh foods. Avoid canned foods and premade or frozen meals. Cooking  Avoid adding salt when cooking. Use salt-free seasonings or herbs instead of table salt or sea salt. Check with your health care provider or pharmacist before using salt substitutes.  Do not fry foods. Cook foods using healthy methods such as baking, boiling, grilling, and broiling instead.  Cook with heart-healthy oils, such as olive, canola, soybean, or sunflower oil. Meal planning  Eat a balanced diet that includes: ? 5 or more servings of fruits and vegetables each day. At each meal, try to fill half of your plate with fruits and vegetables. ? Up to 6-8 servings of whole grains each day. ? Less than 6 oz of lean meat, poultry, or fish each day. A 3-oz serving of meat is about the same size as a deck of cards. One egg equals 1 oz. ? 2 servings of low-fat dairy each day. ? A serving of nuts, seeds, or beans 5 times each week. ? Heart-healthy fats. Healthy fats called Omega-3 fatty acids are found in foods such as flaxseeds and coldwater fish, like sardines, salmon, and mackerel.  Limit how much you eat of the following: ? Canned or prepackaged foods. ? Food that is  high in trans fat, such as fried foods. ? Food that is high in saturated fat, such as fatty meat. ? Sweets, desserts, sugary drinks, and other foods with added sugar. ? Full-fat dairy products.  Do not salt foods before eating.  Try to eat at least 2 vegetarian meals each week.  Eat more home-cooked food and less restaurant, buffet, and fast food.  When eating at a restaurant, ask that your food be prepared with less salt or no salt, if possible. What foods are recommended? The items listed may not be a complete list. Talk with your dietitian about what dietary choices are best for you. Grains Whole-grain or whole-wheat bread.  Whole-grain or whole-wheat pasta. Brown rice. [place]. Bulgur. Whole-grain and low-sodium cereals. Pita bread. Low-fat, low-sodium crackers. Whole-wheat flour tortillas. Vegetables Fresh or frozen vegetables (raw, steamed, roasted, or grilled). Low-sodium or reduced-sodium tomato and vegetable juice. Low-sodium or reduced-sodium tomato sauce and tomato paste. Low-sodium or reduced-sodium canned vegetables. Fruits All fresh, dried, or frozen fruit. Canned fruit in natural juice (without added sugar). Meat and other protein foods Skinless chicken or [place]. Ground chicken or [place]. Pork with fat trimmed off. Fish and seafood. Egg whites. Dried beans, peas, or lentils. Unsalted nuts, nut butters, and seeds. Unsalted canned beans. Lean cuts of beef with fat trimmed off. Low-sodium, lean deli meat. Dairy Low-fat (1%) or fat-free (skim) milk. Fat-free, low-fat, or reduced-fat cheeses. Nonfat, low-sodium ricotta or cottage cheese. Low-fat or nonfat yogurt. Low-fat, low-sodium cheese. Fats and oils Soft margarine without trans fats. Vegetable oil. Low-fat, reduced-fat, or light mayonnaise and salad dressings (reduced-sodium). Canola, safflower, olive, soybean, and sunflower oils. Avocado. Seasoning and other foods Herbs. Spices. Seasoning mixes without salt. Unsalted popcorn and pretzels. Fat-free sweets. What foods are not recommended? The items listed may not be a complete list. Talk with your dietitian about what dietary choices are best for you. Grains Baked goods made with fat, such as croissants, muffins, or some breads. Dry pasta or rice meal packs. Vegetables Creamed or fried vegetables. Vegetables in a cheese sauce. Regular canned vegetables (not low-sodium or reduced-sodium). Regular canned tomato sauce and paste (not low-sodium or reduced-sodium). Regular tomato and vegetable juice (not low-sodium or reduced-sodium). Angie Fava. Olives. Fruits Canned fruit in a light or heavy syrup.  Fried fruit. Fruit in cream or butter sauce. Meat and other protein foods Fatty cuts of meat. Ribs. Fried meat. Berniece Salines. Sausage. Bologna and other processed lunch meats. Salami. Fatback. Hotdogs. Bratwurst. Salted nuts and seeds. Canned beans with added salt. Canned or smoked fish. Whole eggs or egg yolks. Chicken or [place] with skin. Dairy Whole or 2% milk, cream, and half-and-half. Whole or full-fat cream cheese. Whole-fat or sweetened yogurt. Full-fat cheese. Nondairy creamers. Whipped toppings. Processed cheese and cheese spreads. Fats and oils Butter. Stick margarine. Lard. Shortening. Ghee. Bacon fat. Tropical oils, such as coconut, palm kernel, or palm oil. Seasoning and other foods Salted popcorn and pretzels. Onion salt, garlic salt, seasoned salt, table salt, and sea salt. Worcestershire sauce. Tartar sauce. Barbecue sauce. Teriyaki sauce. Soy sauce, including reduced-sodium. Steak sauce. Canned and packaged gravies. Fish sauce. Oyster sauce. Cocktail sauce. Horseradish that you find on the shelf. Ketchup. Mustard. Meat flavorings and tenderizers. Bouillon cubes. Hot sauce and Tabasco sauce. Premade or packaged marinades. Premade or packaged taco seasonings. Relishes. Regular salad dressings. Where to f Calorie Counting for Weight Loss Calories are units of energy. Your body needs a certain amount of calories from food to keep you going throughout  the day. When you eat more calories than your body needs, your body stores the extra calories as fat. When you eat fewer calories than your body needs, your body burns fat to get the energy it needs. Calorie counting means keeping track of how many calories you eat and drink each day. Calorie counting can be helpful if you need to lose weight. If you make sure to eat fewer calories than your body needs, you should lose weight. Ask your health care provider what a healthy weight is for you. For calorie counting to work, you will need to eat the right  number of calories in a day in order to lose a healthy amount of weight per week. A dietitian can help you determine how many calories you need in a day and will give you suggestions on how to reach your calorie goal.  A healthy amount of weight to lose per week is usually 1-2 lb (0.5-0.9 kg). This usually means that your daily calorie intake should be reduced by 500-750 calories.  Eating 1,200 - 1,500 calories per day can help most women lose weight.  Eating 1,500 - 1,800 calories per day can help most men lose weight. What is my plan? My goal is to have __________ calories per day. If I have this many calories per day, I should lose around __________ pounds per week. What do I need to know about calorie counting? In order to meet your daily calorie goal, you will need to:  Find out how many calories are in each food you would like to eat. Try to do this before you eat.  Decide how much of the food you plan to eat.  Write down what you ate and how many calories it had. Doing this is called keeping a food log. To successfully lose weight, it is important to balance calorie counting with a healthy lifestyle that includes regular activity. Aim for 150 minutes of moderate exercise (such as walking) or 75 minutes of vigorous exercise (such as running) each week. Where do I find calorie information?  The number of calories in a food can be found on a Nutrition Facts label. If a food does not have a Nutrition Facts label, try to look up the calories online or ask your dietitian for help. Remember that calories are listed per serving. If you choose to have more than one serving of a food, you will have to multiply the calories per serving by the amount of servings you plan to eat. For example, the label on a package of bread might say that a serving size is 1 slice and that there are 90 calories in a serving. If you eat 1 slice, you will have eaten 90 calories. If you eat 2 slices, you will have eaten  180 calories. How do I keep a food log? Immediately after each meal, record the following information in your food log:  What you ate. Don't forget to include toppings, sauces, and other extras on the food.  How much you ate. This can be measured in cups, ounces, or number of items.  How many calories each food and drink had.  The total number of calories in the meal. Keep your food log near you, such as in a small notebook in your pocket, or use a mobile app or website. Some programs will calculate calories for you and show you how many calories you have left for the day to meet your goal. What are some calorie counting  tips?   Use your calories on foods and drinks that will fill you up and not leave you hungry: ? Some examples of foods that fill you up are nuts and nut butters, vegetables, lean proteins, and high-fiber foods like whole grains. High-fiber foods are foods with more than 5 g fiber per serving. ? Drinks such as sodas, specialty coffee drinks, alcohol, and juices have a lot of calories, yet do not fill you up.  Eat nutritious foods and avoid empty calories. Empty calories are calories you get from foods or beverages that do not have many vitamins or protein, such as candy, sweets, and soda. It is better to have a nutritious high-calorie food (such as an avocado) than a food with few nutrients (such as a bag of chips).  Know how many calories are in the foods you eat most often. This will help you calculate calorie counts faster.  Pay attention to calories in drinks. Low-calorie drinks include water and unsweetened drinks.  Pay attention to nutrition labels for "low fat" or "fat free" foods. These foods sometimes have the same amount of calories or more calories than the full fat versions. They also often have added sugar, starch, or salt, to make up for flavor that was removed with the fat.  Find a way of tracking calories that works for you. Get creative. Try different apps  or programs if writing down calories does not work for you. What are some portion control tips?  Know how many calories are in a serving. This will help you know how many servings of a certain food you can have.  Use a measuring cup to measure serving sizes. You could also try weighing out portions on a kitchen scale. With time, you will be able to estimate serving sizes for some foods.  Take some time to put servings of different foods on your favorite plates, bowls, and cups so you know what a serving looks like.  Try not to eat straight from a bag or box. Doing this can lead to overeating. Put the amount you would like to eat in a cup or on a plate to make sure you are eating the right portion.  Use smaller plates, glasses, and bowls to prevent overeating.  Try not to multitask (for example, watch TV or use your computer) while eating. If it is time to eat, sit down at a table and enjoy your food. This will help you to know when you are full. It will also help you to be aware of what you are eating and how much you are eating. What are tips for following this plan? Reading food labels  Check the calorie count compared to the serving size. The serving size may be smaller than what you are used to eating.  Check the source of the calories. Make sure the food you are eating is high in vitamins and protein and low in saturated and trans fats. Shopping  Read nutrition labels while you shop. This will help you make healthy decisions before you decide to purchase your food.  Make a grocery list and stick to it. Cooking  Try to cook your favorite foods in a healthier way. For example, try baking instead of frying.  Use low-fat dairy products. Meal planning  Use more fruits and vegetables. Half of your plate should be fruits and vegetables.  Include lean proteins like poultry and fish. How do I count calories when eating out?  Ask for smaller portion sizes.  Consider sharing an  entree and sides instead of getting your own entree.  If you get your own entree, eat only half. Ask for a box at the beginning of your meal and put the rest of your entree in it so you are not tempted to eat it.  If calories are listed on the menu, choose the lower calorie options.  Choose dishes that include vegetables, fruits, whole grains, low-fat dairy products, and lean protein.  Choose items that are boiled, broiled, grilled, or steamed. Stay away from items that are buttered, battered, fried, or served with cream sauce. Items labeled "crispy" are usually fried, unless stated otherwise.  Choose water, low-fat milk, unsweetened iced tea, or other drinks without added sugar. If you want an alcoholic beverage, choose a lower calorie option such as a glass of wine or light beer.  Ask for dressings, sauces, and syrups on the side. These are usually high in calories, so you should limit the amount you eat.  If you want a salad, choose a garden salad and ask for grilled meats. Avoid extra toppings like bacon, cheese, or fried items. Ask for the dressing on the side, or ask for olive oil and vinegar or lemon to use as dressing.  Estimate how many servings of a food you are given. For example, a serving of cooked rice is  cup or about the size of half a baseball. Knowing serving sizes will help you be aware of how much food you are eating at restaurants. The list below tells you how big or small some common portion sizes are based on everyday objects: ? 1 oz--4 stacked dice. ? 3 oz--1 deck of cards. ? 1 tsp--1 die. ? 1 Tbsp-- a ping-pong ball. ? 2 Tbsp--1 ping-pong ball. ?  cup-- baseball. ? 1 cup--1 baseball. Summary  Calorie counting means keeping track of how many calories you eat and drink each day. If you eat fewer calories than your body needs, you should lose weight.  A healthy amount of weight to lose per week is usually 1-2 lb (0.5-0.9 kg). This usually means reducing your  daily calorie intake by 500-750 calories.  The number of calories in a food can be found on a Nutrition Facts label. If a food does not have a Nutrition Facts label, try to look up the calories online or ask your dietitian for help.  Use your calories on foods and drinks that will fill you up, and not on foods and drinks that will leave you hungry.  Use smaller plates, glasses, and bowls to prevent overeating. This information is not intended to replace advice given to you by your health care provider. Make sure you discuss any questions you have with your health care provider. Document Revised: 08/18/2018 Document Reviewed: 10/29/2016 Elsevier Patient Education  2020 [place] more information:  National Heart, Lung, and Blood Institute: https://wilson-eaton.com/  American Heart Association: www.heart.org Summary  The DASH eating plan is a healthy eating plan that has been shown to reduce high blood pressure (hypertension). It may also reduce your risk for type 2 diabetes, heart disease, and stroke.  With the DASH eating plan, you should limit salt (sodium) intake to 2,300 mg a day. If you have hypertension, you may need to reduce your sodium intake to 1,500 mg a day.  When on the DASH eating plan, aim to eat more fresh fruits and vegetables, whole grains, lean proteins, low-fat dairy, and heart-healthy fats.  Work with your health care provider  or diet and nutrition specialist (dietitian) to adjust your eating plan to your individual calorie needs. This information is not intended to replace advice given to you by your health care provider. Make sure you discuss any questions you have with your health care provider. Document Revised: 11/11/2017 Document Reviewed: 11/22/2016 Elsevier Patient Education  2020 [employer].

## 2020-03-24 NOTE — Progress Notes (Signed)
New Patient Office Visit  Subjective:  Patient ID: Whitney Gray, female    DOB: Nov 30, 1971  Age: 49 y.o. MRN: [id number]  CC:  Chief Complaint  Patient presents with  . Establish Care    New Patient    HPI Whitney Gray is a 49 y.o. female who  has a past medical history of Amenorrhea; Arthritis; Heart murmur; Pituitary adenoma ([place]); Prolactinoma ([place]), and Covid infection 01/2020.  She presents today to establish care.   Chest tightness: She had Covid in February, and had quite a bit of coughing chest discomfort shortness of breath that has been slowly improving over the last 2 months.  She will lost her sense of taste and smell.  She can smell and taste now but not of all normal like she used to.  Patient was seen in the emergency department 03/03/2000 for cough, shortness of breath, back pain, and initial blood pressure in the ED 186/83 with pulse 7798.8 temp respirations 22 SPO2 100%.  Her chest x-ray was normal.  The diagnosis was inflammation from where the ribs meet the spine.  Patient reports she has indeed improved and has no further chest tightness, back pain or cough.  Non-smoker.  Allergies Seasonal: She has tried meds in the past- Claritin worked but is expensive.  She used Astelin in 2016. She is not taking anything for her allergies except Afrin which she has been on nightly for 1 year off and on.  She was not aware that it was a 3-day over-the-counter treatment medication.  She denies any nosebleeds, sinus pressure, purulent discharge, sinus headache   HTN: BP has been up since at least 2014 per chart review.  She notes that her blood pressure has been elevated since December.  She reports that she was taken off her  HCTZ 12.5 mg in  2018, because her BP was too low. She reports in the emergency room her blood pressure did come down before she was discharged.  She has lower extremity edema.  And has been advised to wear support stockings but she does not know what to  wear.  BP Readings from Last 3 Encounters:  03/24/20 (!) 146/92  03/03/20 (!) 186/83  09/20/17 126/78    Obesity: lost 20 lbs recently from following a healthier diet.  Trying to lose weight. Wt Readings from Last 3 Encounters:  03/24/20 241 lb (109.3 kg)  09/20/17 226 lb 6.4 oz (102.7 kg)  04/05/17 244 lb (110.7 kg)   Depression/anxiety: Patient has been dealing with family stressors and would like to have counseling.  She is not ready for medication at this time.  She is sleeping, working and functioning.  She feels like she needs some stress relief recommendation.  Denies any suicidal or homicidal thoughts.  Past Medical History:  Diagnosis Date  . Amenorrhea   . Arthritis    Bilateral knees  . Asthma   . Heart murmur   . Pituitary adenoma ([place])   . Prolactinoma [facility])     Past Surgical History:  Procedure Laterality Date  . CESAREAN SECTION  2004  . TUBAL LIGATION      Family History  Problem Relation Age of Onset  . Asthma Mother   . Alcohol abuse Father   . Arthritis Father   . Heart disease Father   . Prostate cancer Maternal Grandfather   . Diabetes Maternal Grandfather     Social History   Socioeconomic History  . Marital status: Married    Spouse name:  Not on file  . Number of children: 2  . Years of education: 67  . Highest education level: Not on file  Occupational History  . Occupation: Med [employer]  . Smoking status: Never Smoker  . Smokeless tobacco: Never Used  Substance and Sexual Activity  . Alcohol use: No    Alcohol/week: 0.0 standard drinks  . Drug use: No  . Sexual activity: Not Currently    Partners: Male    Birth control/protection: None  Other Topics Concern  . Not on file  Social History Narrative   Fun: Play games   Denies abuse and feels safe at home.   Denies religious beliefs effecting health care.    Social Determinants of Health   Financial Resource Strain:   . Difficulty of Paying Living Expenses:    Food Insecurity:   . Worried About Charity fundraiser in the Last Year:   . Arboriculturist in the Last Year:   Transportation Needs:   . Film/video editor (Medical):   Marland Kitchen Lack of Transportation (Non-Medical):   Physical Activity:   . Days of Exercise per Week:   . Minutes of Exercise per Session:   Stress:   . Feeling of Stress :   Social Connections:   . Frequency of Communication with Friends and Family:   . Frequency of Social Gatherings with Friends and Family:   . Attends Religious Services:   . Active Member of Clubs or Organizations:   . Attends Archivist Meetings:   Marland Kitchen Marital Status:   Intimate Partner Violence:   . Fear of Current or Ex-Partner:   . Emotionally Abused:   Marland Kitchen Physically Abused:   . Sexually Abused:     ROS Review of Systems  Constitutional: Negative for chills and fever.  HENT: Negative for postnasal drip.   Respiratory: Negative for cough and shortness of breath.   Cardiovascular: Positive for leg swelling.       She had chest tightness with Covid first week in Feb. Now she has very little residual. Getting better.  Sees a vein doctor and has varicose veins with chronic mild swelling.    Gastrointestinal: Negative for abdominal pain and nausea.  Endocrine: Negative.   Genitourinary: Negative for difficulty urinating and frequency.  Musculoskeletal: Negative.   Skin: Negative.   Allergic/Immunologic: Positive for environmental allergies.  Neurological: Negative for dizziness and syncope.  Hematological: Negative for adenopathy. Does not bruise/bleed easily.  Psychiatric/Behavioral:       Denies depression and anxiety but scores GAD-7 is 15, PHQ-9-13. Family stressors. Agreeable to getting counseling.     Objective:   Today's Vitals: BP (!) 146/92 (BP Location: Left Arm, Patient Position: Sitting, Cuff Size: Normal)   Pulse 78   Temp 98 F (36.7 C) (Temporal)   Resp 16   Ht 5' 3"  (1.6 m)   Wt 241 lb (109.3 kg)   LMP  (LMP  Unknown)   SpO2 99%   BMI 42.69 kg/m   Physical Exam Constitutional:      Appearance: Normal appearance.  HENT:     Head: Normocephalic and atraumatic.  Cardiovascular:     Rate and Rhythm: Normal rate and regular rhythm.     Pulses: Normal pulses.  Pulmonary:     Effort: Pulmonary effort is normal.     Breath sounds: Normal breath sounds.  Neurological:     General: No focal deficit present.     Mental Status: She is  alert and oriented to person, place, and time.  Psychiatric:        Mood and Affect: Mood normal.        Behavior: Behavior normal.     Assessment & Plan:   Problem List Items Addressed This Visit      Cardiovascular and Mediastinum   Hypertension   Relevant Medications   hydrochlorothiazide (MICROZIDE) 12.5 MG capsule   Other Relevant Orders   CBC With Differential   Comp Met (CMET)   Lipid panel     Endocrine   Pituitary adenoma (HCC)   Relevant Orders   TSH     Other   Seasonal allergies - Primary    Other Visit Diagnoses    Anxiety and depression       Relevant Orders   Ambulatory referral to Psychology      Outpatient Encounter Medications as of 03/24/2020  Medication Sig  . ALBUTEROL IN Inhale into the lungs.  . fluticasone (FLONASE) 50 MCG/ACT nasal spray Place 2 sprays into both nostrils daily.  . hydrochlorothiazide (MICROZIDE) 12.5 MG capsule Take 1 capsule (12.5 mg total) by mouth daily.  . [DISCONTINUED] fluticasone (FLONASE) 50 MCG/ACT nasal spray Place 2 sprays into both nostrils daily.  . [DISCONTINUED] fluticasone (FLONASE) 50 MCG/ACT nasal spray Place 2 sprays into both nostrils daily.  . [DISCONTINUED] hydrochlorothiazide (MICROZIDE) 12.5 MG capsule Take 1 capsule (12.5 mg total) by mouth daily.  . [DISCONTINUED] ibuprofen (ADVIL,MOTRIN) 800 MG tablet Take 1 tablet (800 mg total) by mouth every 8 (eight) hours as needed. (Patient not taking: Reported on 03/24/2020)  . [DISCONTINUED] predniSONE (DELTASONE) 20 MG tablet one  daily with food (Patient not taking: Reported on 03/24/2020)  . [DISCONTINUED] sodium chloride (OCEAN) 0.65 % SOLN nasal spray Place 1 spray into both nostrils as needed for congestion. (Patient not taking: Reported on 03/24/2020)   No facility-administered encounter medications on file as of 03/24/2020.   Begin HCTZ 12.5 mg for your BP and record BP daily and bring in record.   STOP the Afrin.   Begin Flonase nasal spray as directed.   Please go to the lab tomorrow- [employer] employee   Do not drink sweetened drinks.  We discussed healthy diet, physical activity, stress management in detail.  Referral to counseling with a female provider placed.   EXPRESSIONS: in [place] for support stockings 20-30 mmHg as recommended by vein doctor - thigh high.  See me back in 2 weeks for lab and BP check on the HCTZ  This visit occurred during the SARS-CoV-2 public health emergency.  Safety protocols were in place, including screening questions prior to the visit, additional usage of staff PPE, and extensive cleaning of exam room while observing appropriate contact time as indicated for disinfecting solutions.   Follow-up: Return in about 2 weeks (around 04/07/2020).   Denice Paradise, NP

## 2020-03-25 ENCOUNTER — Encounter: Payer: Self-pay | Admitting: Nurse Practitioner

## 2020-03-25 DIAGNOSIS — I10 Essential (primary) hypertension: Secondary | ICD-10-CM | POA: Diagnosis not present

## 2020-03-25 DIAGNOSIS — F418 Other specified anxiety disorders: Secondary | ICD-10-CM

## 2020-03-25 DIAGNOSIS — D352 Benign neoplasm of pituitary gland: Secondary | ICD-10-CM | POA: Diagnosis not present

## 2020-03-25 HISTORY — DX: Other specified anxiety disorders: F41.8

## 2020-03-26 LAB — CBC WITH DIFFERENTIAL
Basophils Absolute: 0 10*3/uL (ref 0.0–0.2)
Basos: 0 %
EOS (ABSOLUTE): 0.1 10*3/uL (ref 0.0–0.4)
Eos: 2 %
Hematocrit: 37.2 % (ref 34.0–46.6)
Hemoglobin: 12.1 g/dL (ref 11.1–15.9)
Immature Grans (Abs): 0 10*3/uL (ref 0.0–0.1)
Immature Granulocytes: 1 %
Lymphocytes Absolute: 0.9 10*3/uL (ref 0.7–3.1)
Lymphs: 17 %
MCH: 26.9 pg (ref 26.6–33.0)
MCHC: 32.5 g/dL (ref 31.5–35.7)
MCV: 83 fL (ref 79–97)
Monocytes Absolute: 0.4 10*3/uL (ref 0.1–0.9)
Monocytes: 7 %
Neutrophils Absolute: 4 10*3/uL (ref 1.4–7.0)
Neutrophils: 73 %
RBC: 4.5 x10E6/uL (ref 3.77–5.28)
RDW: 14.6 % (ref 11.7–15.4)
WBC: 5.5 10*3/uL (ref 3.4–10.8)

## 2020-03-26 LAB — COMPREHENSIVE METABOLIC PANEL
ALT: 16 IU/L (ref 0–32)
AST: 20 IU/L (ref 0–40)
Albumin/Globulin Ratio: 1.5 (ref 1.2–2.2)
Albumin: 4.4 g/dL (ref 3.8–4.8)
Alkaline Phosphatase: 87 IU/L (ref 39–117)
BUN/Creatinine Ratio: 12 (ref 9–23)
BUN: 12 mg/dL (ref 6–24)
Bilirubin Total: 0.5 mg/dL (ref 0.0–1.2)
CO2: 24 mmol/L (ref 20–29)
Calcium: 9.6 mg/dL (ref 8.7–10.2)
Chloride: 101 mmol/L (ref 96–106)
Creatinine, Ser: 1.02 mg/dL — ABNORMAL HIGH (ref 0.57–1.00)
GFR calc Af Amer: 74 mL/min/{1.73_m2} (ref >59)
GFR calc non Af Amer: 64 mL/min/{1.73_m2} (ref >59)
Globulin, Total: 2.9 g/dL (ref 1.5–4.5)
Glucose: 86 mg/dL (ref 65–99)
Potassium: 4.1 mmol/L (ref 3.5–5.2)
Sodium: 141 mmol/L (ref 134–144)
Total Protein: 7.3 g/dL (ref 6.0–8.5)

## 2020-03-26 LAB — LIPID PANEL
Chol/HDL Ratio: 3.8 ratio (ref 0.0–4.4)
Cholesterol, Total: 158 mg/dL (ref 100–199)
HDL: 42 mg/dL (ref >39)
LDL Chol Calc (NIH): 96 mg/dL (ref 0–99)
Triglycerides: 109 mg/dL (ref 0–149)
VLDL Cholesterol Cal: 20 mg/dL (ref 5–40)

## 2020-03-26 LAB — TSH: TSH: 0.83 u[IU]/mL (ref 0.450–4.500)

## 2020-04-07 ENCOUNTER — Ambulatory Visit: Payer: BC Managed Care – PPO | Admitting: Nurse Practitioner

## 2020-04-07 ENCOUNTER — Encounter: Payer: Self-pay | Admitting: Nurse Practitioner

## 2020-04-07 ENCOUNTER — Other Ambulatory Visit: Payer: Self-pay

## 2020-04-07 VITALS — BP 132/82 | HR 82 | Temp 97.8°F | Ht 63.0 in | Wt 228.4 lb

## 2020-04-07 DIAGNOSIS — I10 Essential (primary) hypertension: Secondary | ICD-10-CM

## 2020-04-07 DIAGNOSIS — Z6841 Body Mass Index (BMI) 40.0 and over, adult: Secondary | ICD-10-CM | POA: Diagnosis not present

## 2020-04-07 NOTE — Patient Instructions (Addendum)
It was nice to see you again.   Please go to the [facility] for blood test to check on the fluid pill effects.    Please check your BP at home  and bring in record to review.   I will place a referral in for the [facility] program.       Obesity, Adult Obesity is the condition of having too much total body fat. Being overweight or obese means that your weight is greater than what is considered healthy for your body size. Obesity is determined by a measurement called BMI. BMI is an estimate of body fat and is calculated from height and weight. For adults, a BMI of 30 or higher is considered obese. Obesity can lead to other health concerns and major illnesses, including:  Stroke.  Coronary artery disease (CAD).  Type 2 diabetes.  Some types of cancer, including cancers of the colon, breast, uterus, and gallbladder.  Osteoarthritis.  High blood pressure (hypertension).  High cholesterol.  Sleep apnea.  Gallbladder stones.  Infertility problems. What are the causes? Common causes of this condition include:  Eating daily meals that are high in calories, sugar, and fat.  Being born with genes that may make you more likely to become obese.  Having a medical condition that causes obesity, including: ? Hypothyroidism. ? Polycystic ovarian syndrome (PCOS). ? Binge-eating disorder. ? Cushing syndrome.  Taking certain medicines, such as steroids, antidepressants, and seizure medicines.  Not being physically active (sedentary lifestyle).  Not getting enough sleep.  Drinking high amounts of sugar-sweetened beverages, such as soft drinks. What increases the risk? The following factors may make you more likely to develop this condition:  Having a family history of obesity.  Being a woman of African American descent.  Being a man of Hispanic descent.  Living in an area with limited access to: ? [place], recreation centers, or sidewalks. ? Healthy food choices, such  as grocery stores and farmers' markets. What are the signs or symptoms? The main sign of this condition is having too much body fat. How is this diagnosed? This condition is diagnosed based on:  Your BMI. If you are an adult with a BMI of 30 or higher, you are considered obese.  Your waist circumference. This measures the distance around your waistline.  Your skinfold thickness. Your health care provider may gently pinch a fold of your skin and measure it. You may have other tests to check for underlying conditions. How is this treated? Treatment for this condition often includes changing your lifestyle. Treatment may include some or all of the following:  Dietary changes. This may include developing a healthy meal plan.  Regular physical activity. This may include activity that causes your heart to beat faster (aerobic exercise) and strength training. Work with your health care provider to design an exercise program that works for you.  Medicine to help you lose weight if you are unable to lose 1 pound a week after 6 weeks of healthy eating and more physical activity.  Treating conditions that cause the obesity (underlying conditions).  Surgery. Surgical options may include gastric banding and gastric bypass. Surgery may be done if: ? Other treatments have not helped to improve your condition. ? You have a BMI of 40 or higher. ? You have life-threatening health problems related to obesity. Follow these instructions at home: Eating and drinking   Follow recommendations from your health care provider about what you eat and drink. Your health care provider  may advise you to: ? Limit fast food, sweets, and processed snack foods. ? Choose low-fat options, such as low-fat milk instead of whole milk. ? Eat 5 or more servings of fruits or vegetables every day. ? Eat at home more often. This gives you more control over what you eat. ? Choose healthy foods when you eat out. ? Learn to  read food labels. This will help you understand how much food is considered 1 serving. ? Learn what a healthy serving size is. ? Keep low-fat snacks available. ? Limit sugary drinks, such as soda, fruit juice, sweetened iced tea, and flavored milk.  Drink enough water to keep your urine pale yellow.  Do not follow a fad diet. Fad diets can be unhealthy and even dangerous. Physical activity  Exercise regularly, as told by your health care provider. ? Most adults should get up to 150 minutes of moderate-intensity exercise every week. ? Ask your health care provider what types of exercise are safe for you and how often you should exercise.  Warm up and stretch before being active.  Cool down and stretch after being active.  Rest between periods of activity. Lifestyle  Work with your health care provider and a dietitian to set a weight-loss goal that is healthy and reasonable for you.  Limit your screen time.  Find ways to reward yourself that do not involve food.  Do not drink alcohol if: ? Your health care provider tells you not to drink. ? You are pregnant, may be pregnant, or are planning to become pregnant.  If you drink alcohol: ? Limit how much you use to:  0-1 drink a day for women.  0-2 drinks a day for men. ? Be aware of how much alcohol is in your drink. In the [place] one drink equals one 12 oz bottle of beer (355 mL), one 5 oz glass of wine (148 mL), or one 1 oz glass of hard liquor (44 mL). General instructions  Keep a weight-loss journal to keep track of the food you eat and how much exercise you get.  Take over-the-counter and prescription medicines only as told by your health care provider.  Take vitamins and supplements only as told by your health care provider.  Consider joining a support group. Your health care provider may be able to recommend a support group.  Keep all follow-up visits as told by your health care provider. This is important. Contact a  health care provider if:  You are unable to meet your weight loss goal after 6 weeks of dietary and lifestyle changes. Get help right away if you are having:  Trouble breathing.  Suicidal thoughts or behaviors. Summary  Obesity is the condition of having too much total body fat.  Being overweight or obese means that your weight is greater than what is considered healthy for your body size.  Work with your health care provider and a dietitian to set a weight-loss goal that is healthy and reasonable for you.  Exercise regularly, as told by your health care provider. Ask your health care provider what types of exercise are safe for you and how often you should exercise. This information is not intended to replace advice given to you by your health care provider. Make sure you discuss any questions you have with your health care provider. Document Revised: 08/03/2018 Document Reviewed: 08/03/2018 Elsevier Patient Education  [place].  Obesity, Adult Obesity is the condition of having too much total body fat. Being  overweight or obese means that your weight is greater than what is considered healthy for your body size. Obesity is determined by a measurement called BMI. BMI is an estimate of body fat and is calculated from height and weight. For adults, a BMI of 30 or higher is considered obese. Obesity can lead to other health concerns and major illnesses, including:  Stroke.  Coronary artery disease (CAD).  Type 2 diabetes.  Some types of cancer, including cancers of the colon, breast, uterus, and gallbladder.  Osteoarthritis.  High blood pressure (hypertension).  High cholesterol.  Sleep apnea.  Gallbladder stones.  Infertility problems. What are the causes? Common causes of this condition include:  Eating daily meals that are high in calories, sugar, and fat.  Being born with genes that may make you more likely to become obese.  Having a medical condition  that causes obesity, including: ? Hypothyroidism. ? Polycystic ovarian syndrome (PCOS). ? Binge-eating disorder. ? Cushing syndrome.  Taking certain medicines, such as steroids, antidepressants, and seizure medicines.  Not being physically active (sedentary lifestyle).  Not getting enough sleep.  Drinking high amounts of sugar-sweetened beverages, such as soft drinks. What increases the risk? The following factors may make you more likely to develop this condition:  Having a family history of obesity.  Being a woman of African American descent.  Being a man of Hispanic descent.  Living in an area with limited access to: ? [place], recreation centers, or sidewalks. ? Healthy food choices, such as grocery stores and farmers' markets. What are the signs or symptoms? The main sign of this condition is having too much body fat. How is this diagnosed? This condition is diagnosed based on:  Your BMI. If you are an adult with a BMI of 30 or higher, you are considered obese.  Your waist circumference. This measures the distance around your waistline.  Your skinfold thickness. Your health care provider may gently pinch a fold of your skin and measure it. You may have other tests to check for underlying conditions. How is this treated? Treatment for this condition often includes changing your lifestyle. Treatment may include some or all of the following:  Dietary changes. This may include developing a healthy meal plan.  Regular physical activity. This may include activity that causes your heart to beat faster (aerobic exercise) and strength training. Work with your health care provider to design an exercise program that works for you.  Medicine to help you lose weight if you are unable to lose 1 pound a week after 6 weeks of healthy eating and more physical activity.  Treating conditions that cause the obesity (underlying conditions).  Surgery. Surgical options may include gastric  banding and gastric bypass. Surgery may be done if: ? Other treatments have not helped to improve your condition. ? You have a BMI of 40 or higher. ? You have life-threatening health problems related to obesity. Follow these instructions at home: Eating and drinking   Follow recommendations from your health care provider about what you eat and drink. Your health care provider may advise you to: ? Limit fast food, sweets, and processed snack foods. ? Choose low-fat options, such as low-fat milk instead of whole milk. ? Eat 5 or more servings of fruits or vegetables every day. ? Eat at home more often. This gives you more control over what you eat. ? Choose healthy foods when you eat out. ? Learn to read food labels. This will help you understand how much food  is considered 1 serving. ? Learn what a healthy serving size is. ? Keep low-fat snacks available. ? Limit sugary drinks, such as soda, fruit juice, sweetened iced tea, and flavored milk.  Drink enough water to keep your urine pale yellow.  Do not follow a fad diet. Fad diets can be unhealthy and even dangerous. Physical activity  Exercise regularly, as told by your health care provider. ? Most adults should get up to 150 minutes of moderate-intensity exercise every week. ? Ask your health care provider what types of exercise are safe for you and how often you should exercise.  Warm up and stretch before being active.  Cool down and stretch after being active.  Rest between periods of activity. Lifestyle  Work with your health care provider and a dietitian to set a weight-loss goal that is healthy and reasonable for you.  Limit your screen time.  Find ways to reward yourself that do not involve food.  Do not drink alcohol if: ? Your health care provider tells you not to drink. ? You are pregnant, may be pregnant, or are planning to become pregnant.  If you drink alcohol: ? Limit how much you use to:  0-1 drink a day  for women.  0-2 drinks a day for men. ? Be aware of how much alcohol is in your drink. In the [place] one drink equals one 12 oz bottle of beer (355 mL), one 5 oz glass of wine (148 mL), or one 1 oz glass of hard liquor (44 mL). General instructions  Keep a weight-loss journal to keep track of the food you eat and how much exercise you get.  Take over-the-counter and prescription medicines only as told by your health care provider.  Take vitamins and supplements only as told by your health care provider.  Consider joining a support group. Your health care provider may be able to recommend a support group.  Keep all follow-up visits as told by your health care provider. This is important. Contact a health care provider if:  You are unable to meet your weight loss goal after 6 weeks of dietary and lifestyle changes. Get help right away if you are having:  Trouble breathing.  Suicidal thoughts or behaviors. Summary  Obesity is the condition of having too much total body fat.  Being overweight or obese means that your weight is greater than what is considered healthy for your body size.  Work with your health care provider and a dietitian to set a weight-loss goal that is healthy and reasonable for you.  Exercise regularly, as told by your health care provider. Ask your health care provider what types of exercise are safe for you and how often you should exercise. This information is not intended to replace advice given to you by your health care provider. Make sure you discuss any questions you have with your health care provider. Document Revised: 08/03/2018 Document Reviewed: 08/03/2018 Elsevier Patient Education  2020 [employer].

## 2020-04-07 NOTE — Progress Notes (Signed)
Established Patient Office Visit  Subjective:  Patient ID: Whitney Gray, female    DOB: 10/30/1971  Age: 49 y.o. MRN: [id number]  CC:  Chief Complaint  Patient presents with  . Follow-up    recheck    HPI Whitney Gray presents for HTN on new start HCTZ 12. 5 mg alone.  She denies any edema leg cramps, pedal edema resolved, and she thinks the medication is working well.  She presents today for blood pressure check and to et a Bmet.   She is feeling better. She is taking the allergy pill and nasal spray- less congested. Not using Afrin- threw it away. Her BP is in better control. At home, she gets 129/73-134/82 She is eating less calories, salt, sugar,  bread and sweet drinks- more veggies and fruit. She relaxed at her mothers house. Her taste is back. No chest tightness. Smell is returning.Less fatigue. Recovering from [place] in Feb and now feeling better.  BP Readings from Last 3 Encounters:  04/07/20 132/82  03/24/20 (!) 146/92  03/03/20 (!) 186/83    Obesity: BMI 40  Wt Readings from Last 3 Encounters:  04/07/20 228 lb 6.4 oz (103.6 kg)  03/24/20 241 lb (109.3 kg)  09/20/17 226 lb 6.4 oz (102.7 kg)     Past Medical History:  Diagnosis Date  . Amenorrhea   . Arthritis    Bilateral knees  . Asthma   . Depression with anxiety 03/25/2020  . Heart murmur   . Pituitary adenoma ([place])   . Prolactinoma [facility])     Past Surgical History:  Procedure Laterality Date  . CESAREAN SECTION  2004  . TUBAL LIGATION      Family History  Problem Relation Age of Onset  . Asthma Mother   . Alcohol abuse Father   . Arthritis Father   . Heart disease Father   . Prostate cancer Maternal Grandfather   . Diabetes Maternal Grandfather     Social History   Socioeconomic History  . Marital status: Married    Spouse name: Not on file  . Number of children: 2  . Years of education: 13  . Highest education level: Not on file  Occupational History  . Occupation: Med [employer]  . Smoking status: Never Smoker  . Smokeless tobacco: Never Used  Substance and Sexual Activity  . Alcohol use: No    Alcohol/week: 0.0 standard drinks  . Drug use: No  . Sexual activity: Not Currently    Partners: Male    Birth control/protection: None  Other Topics Concern  . Not on file  Social History Narrative   Fun: Play games   Denies abuse and feels safe at home.   Denies religious beliefs effecting health care.    Social Determinants of Health   Financial Resource Strain:   . Difficulty of Paying Living Expenses:   Food Insecurity:   . Worried About Charity fundraiser in the Last Year:   . Arboriculturist in the Last Year:   Transportation Needs:   . Film/video editor (Medical):   Marland Kitchen Lack of Transportation (Non-Medical):   Physical Activity:   . Days of Exercise per Week:   . Minutes of Exercise per Session:   Stress:   . Feeling of Stress :   Social Connections:   . Frequency of Communication with Friends and Family:   . Frequency of Social Gatherings with Friends and Family:   . Attends Religious  Services:   . Active Member of Clubs or Organizations:   . Attends Archivist Meetings:   Marland Kitchen Marital Status:   Intimate Partner Violence:   . Fear of Current or Ex-Partner:   . Emotionally Abused:   Marland Kitchen Physically Abused:   . Sexually Abused:     Outpatient Medications Prior to Visit  Medication Sig Dispense Refill  . ALBUTEROL IN Inhale into the lungs.    . fluticasone (FLONASE) 50 MCG/ACT nasal spray Place 2 sprays into both nostrils daily. 9.9 mL 3  . hydrochlorothiazide (MICROZIDE) 12.5 MG capsule Take 1 capsule (12.5 mg total) by mouth daily. 30 capsule 0   No facility-administered medications prior to visit.    Allergies  Allergen Reactions  . Latex Rash  . Codeine Nausea Only    Per pt,nausea,vomiting,dizzy.      Review of Systems Pertinent positives noted in history of present illness.   Objective:    Physical  Exam  Constitutional: She is oriented to person, place, and time. She appears well-developed.  HENT:  Head: Normocephalic.  Cardiovascular: Normal rate, regular rhythm and normal heart sounds.  Pulmonary/Chest: Effort normal and breath sounds normal.  Musculoskeletal:        General: No edema.  Neurological: She is alert and oriented to person, place, and time.  Skin: Skin is warm and dry.  Psychiatric: She has a normal mood and affect. Her behavior is normal.    BP 132/82 (BP Location: Left Arm, Patient Position: Sitting, Cuff Size: Large)   Pulse 82   Temp 97.8 F (36.6 C) (Skin)   Ht 5\' 3"  (1.6 m)   Wt 228 lb 6.4 oz (103.6 kg)   LMP  (LMP Unknown)   SpO2 99%   BMI 40.46 kg/m  Wt Readings from Last 3 Encounters:  04/07/20 228 lb 6.4 oz (103.6 kg)  03/24/20 241 lb (109.3 kg)  09/20/17 226 lb 6.4 oz (102.7 kg)     Health Maintenance Due  Topic Date Due  . HIV Screening  Never done  . MAMMOGRAM  Never done  . COLONOSCOPY  Never done  . PAP SMEAR-Modifier  08/13/2019    There are no preventive care reminders to display for this patient.  Lab Results  Component Value Date   TSH 0.830 03/25/2020   Lab Results  Component Value Date   WBC 5.5 03/25/2020   HGB 12.1 03/25/2020   HCT 37.2 03/25/2020   MCV 83 03/25/2020   PLT 210.0 09/20/2017   Lab Results  Component Value Date   NA 141 03/25/2020   K 4.1 03/25/2020   CO2 24 03/25/2020   GLUCOSE 86 03/25/2020   BUN 12 03/25/2020   CREATININE 1.02 (H) 03/25/2020   BILITOT 0.5 03/25/2020   ALKPHOS 87 03/25/2020   AST 20 03/25/2020   ALT 16 03/25/2020   PROT 7.3 03/25/2020   ALBUMIN 4.4 03/25/2020   CALCIUM 9.6 03/25/2020   GFR 90.49 09/20/2017   Lab Results  Component Value Date   CHOL 158 03/25/2020   Lab Results  Component Value Date   HDL 42 03/25/2020   Lab Results  Component Value Date   LDLCALC 96 03/25/2020   Lab Results  Component Value Date   TRIG 109 03/25/2020   Lab Results    Component Value Date   CHOLHDL 3.8 03/25/2020   No results found for: HGBA1C    Assessment & Plan:   Problem List Items Addressed This Visit  Cardiovascular and Mediastinum   Hypertension - Primary   Relevant Orders   Basic Metabolic Panel (BMET)   Amb Ref to Medical Weight Management     Other   Obesity   Relevant Orders   Amb Ref to Medical Weight Management    Please go to the [facility] for blood test to check on the fluid pill effects.   Please check your BP at home  and bring in record to review.   I will place a referral in for the [facility] program per her request.  No orders of the defined types were placed in this encounter.   Follow-up: Return in about 2 months (around 06/07/2020).   This visit occurred during the SARS-CoV-2 public health emergency.  Safety protocols were in place, including screening questions prior to the visit, additional usage of staff PPE, and extensive cleaning of exam room while observing appropriate contact time as indicated for disinfecting solutions.   Denice Paradise, NP

## 2020-04-08 DIAGNOSIS — I10 Essential (primary) hypertension: Secondary | ICD-10-CM | POA: Diagnosis not present

## 2020-04-09 LAB — BASIC METABOLIC PANEL
BUN/Creatinine Ratio: 13 (ref 9–23)
BUN: 13 mg/dL (ref 6–24)
CO2: 25 mmol/L (ref 20–29)
Calcium: 9.5 mg/dL (ref 8.7–10.2)
Chloride: 101 mmol/L (ref 96–106)
Creatinine, Ser: 0.99 mg/dL (ref 0.57–1.00)
GFR calc Af Amer: 77 mL/min/{1.73_m2} (ref >59)
GFR calc non Af Amer: 67 mL/min/{1.73_m2} (ref >59)
Glucose: 87 mg/dL (ref 65–99)
Potassium: 3.9 mmol/L (ref 3.5–5.2)
Sodium: 141 mmol/L (ref 134–144)

## 2020-04-15 DIAGNOSIS — I83813 Varicose veins of bilateral lower extremities with pain: Secondary | ICD-10-CM | POA: Diagnosis not present

## 2020-04-15 DIAGNOSIS — M7989 Other specified soft tissue disorders: Secondary | ICD-10-CM | POA: Diagnosis not present

## 2020-04-20 ENCOUNTER — Other Ambulatory Visit: Payer: Self-pay | Admitting: Nurse Practitioner

## 2020-05-06 ENCOUNTER — Telehealth: Payer: Self-pay | Admitting: Nurse Practitioner

## 2020-05-06 DIAGNOSIS — I83899 Varicose veins of unspecified lower extremities with other complications: Secondary | ICD-10-CM

## 2020-05-06 DIAGNOSIS — R609 Edema, unspecified: Secondary | ICD-10-CM

## 2020-05-06 NOTE — Telephone Encounter (Signed)
She has chronic LEXT edema and varicose veins. She wears compression stocking. She is requesting a vascular referral .

## 2020-05-06 NOTE — Addendum Note (Signed)
Addended by: Denice Paradise A on: 05/06/2020 04:47 PM   Modules accepted: Orders

## 2020-05-06 NOTE — Telephone Encounter (Signed)
Patient needs referral to vein and vascular for her varicose veins; looks like was discussed some at [facility] on 03/24/20

## 2020-05-06 NOTE — Telephone Encounter (Signed)
Pt called she would like a referral to [facility] for her legs

## 2020-05-17 ENCOUNTER — Encounter (INDEPENDENT_AMBULATORY_CARE_PROVIDER_SITE_OTHER): Payer: Self-pay

## 2020-05-30 ENCOUNTER — Other Ambulatory Visit (INDEPENDENT_AMBULATORY_CARE_PROVIDER_SITE_OTHER): Payer: Self-pay | Admitting: Nurse Practitioner

## 2020-05-30 DIAGNOSIS — I83899 Varicose veins of unspecified lower extremities with other complications: Secondary | ICD-10-CM

## 2020-06-02 ENCOUNTER — Encounter (INDEPENDENT_AMBULATORY_CARE_PROVIDER_SITE_OTHER): Payer: Self-pay | Admitting: Nurse Practitioner

## 2020-06-02 ENCOUNTER — Ambulatory Visit (INDEPENDENT_AMBULATORY_CARE_PROVIDER_SITE_OTHER): Payer: BC Managed Care – PPO | Admitting: Nurse Practitioner

## 2020-06-02 ENCOUNTER — Other Ambulatory Visit: Payer: Self-pay

## 2020-06-02 ENCOUNTER — Ambulatory Visit (INDEPENDENT_AMBULATORY_CARE_PROVIDER_SITE_OTHER): Payer: BC Managed Care – PPO

## 2020-06-02 VITALS — BP 153/82 | HR 68 | Resp 16 | Wt 226.2 lb

## 2020-06-02 DIAGNOSIS — I83899 Varicose veins of unspecified lower extremities with other complications: Secondary | ICD-10-CM

## 2020-06-02 DIAGNOSIS — I83813 Varicose veins of bilateral lower extremities with pain: Secondary | ICD-10-CM | POA: Diagnosis not present

## 2020-06-02 DIAGNOSIS — I10 Essential (primary) hypertension: Secondary | ICD-10-CM | POA: Diagnosis not present

## 2020-06-05 ENCOUNTER — Telehealth: Payer: Self-pay | Admitting: Nurse Practitioner

## 2020-06-05 ENCOUNTER — Other Ambulatory Visit: Payer: Self-pay

## 2020-06-05 NOTE — Telephone Encounter (Signed)
Pt called in stated that she was having problem taste and smell

## 2020-06-05 NOTE — Telephone Encounter (Signed)
Patient had COVID 01/21 and has had problems with her taste and smell since then. Patient had to stop taking her allergy medication because its been making her drowsy. Patient has been better since then. Patient having a lot of nasal congestion and getting some watery eyes off and on. Patient does use a nasal spray twice a week. Patient requesting an antibiotic. She has an appt set for Monday that will need to be virtual since she has sx. She will want to cancel this if she needs to be seen at [facility] if she cant get a prescription from [place]. Please advise

## 2020-06-05 NOTE — Telephone Encounter (Signed)
I called the patient and advised her to seek care at an Urgent Care if she feels she cannot wait until her appt on Monday.

## 2020-06-06 ENCOUNTER — Telehealth: Payer: Self-pay | Admitting: Nurse Practitioner

## 2020-06-06 NOTE — Telephone Encounter (Signed)
Spoke w/the patient and explained that our program would be a customized eating plan for her.  She stated she is going to wait on this, did not want to schedule. msg from [employer]

## 2020-06-08 ENCOUNTER — Encounter (INDEPENDENT_AMBULATORY_CARE_PROVIDER_SITE_OTHER): Payer: Self-pay | Admitting: Nurse Practitioner

## 2020-06-08 NOTE — Progress Notes (Signed)
Subjective:    Patient ID: Whitney Gray, female    DOB: 1971/11/26, 49 y.o.   MRN: [id number] Chief Complaint  Patient presents with  . New Patient (Initial Visit)    ref Jerelene Redden varicose veins w/edema    The patient is seen for evaluation of symptomatic varicose veins. The patient relates burning and stinging which worsened steadily throughout the course of the day, particularly with standing. The patient also notes an aching and throbbing pain over the varicosities, particularly with prolonged dependent positions. The symptoms are significantly improved with elevation.  The patient also notes that during hot weather the symptoms are greatly intensified. The patient states the pain from the varicose veins interferes with work, daily exercise, shopping and household maintenance. At this point, the symptoms are persistent and severe enough that they're having a negative impact on lifestyle and are interfering with daily activities.  The patient has utilized medical grade 1 compression stockings for greater than 1 year.  The patient has also had sclerotherapy years ago however this was ineffective.  In addition to the problems outlined above the patient also has had hematuria for on the right lower extremity after rupture of her varicosities.  The patient also has issues with swelling of bilateral lower extremities.  There is no history of DVT, PE or superficial thrombophlebitis. There is no history of ulceration or hemorrhage. The patient denies a significant family history of varicose veins.  Today noninvasive studies show evidence of deep venous insufficiency in the right lower extremity to the femoral vein.  The bilateral popliteal veins have evidence of deep venous insufficiency.  The right lower extremity has evidence of reflux in the great saphenous vein at the saphenofemoral junction extending to the proximal calf.  The vein diameters range from 0.60cm to 0.77 cm.  The left lower extremity has  evidence of superficial venous reflux in the great saphenous vein at the saphenofemoral junction extending to the mid thigh in the vein diameters range from 0.44 cm to 0.92 cm.  There is no evidence of DVT or superficial venous thrombosis bilaterally.   Review of Systems  Cardiovascular: Positive for leg swelling.  Hematological: Bruises/bleeds easily.  All other systems reviewed and are negative.      Objective:   Physical Exam Vitals reviewed.  Cardiovascular:     Rate and Rhythm: Normal rate and regular rhythm.     Pulses: Normal pulses.  Pulmonary:     Effort: Pulmonary effort is normal.     Breath sounds: Normal breath sounds.  Musculoskeletal:     Cervical back: Tenderness present.     Right lower leg: Edema present.     Left lower leg: Edema present.  Skin:    Findings: Bruising present.  Neurological:     Mental Status: She is alert and oriented to person, place, and time.  Psychiatric:        Mood and Affect: Mood normal.        Behavior: Behavior normal.        Thought Content: Thought content normal.        Judgment: Judgment normal.     BP (!) 153/82 (BP Location: Right Arm)   Pulse 68   Resp 16   Wt 226 lb 3.2 oz (102.6 kg)   LMP  (LMP Unknown)   BMI 40.07 kg/m   Past Medical History:  Diagnosis Date  . Amenorrhea   . Arthritis    Bilateral knees  . Asthma   . Depression  with anxiety 03/25/2020  . Heart murmur   . Pituitary adenoma ([place])   . Prolactinoma [facility])     Social History   Socioeconomic History  . Marital status: Married    Spouse name: Not on file  . Number of children: 2  . Years of education: 60  . Highest education level: Not on file  Occupational History  . Occupation: Med [employer]  . Smoking status: Never Smoker  . Smokeless tobacco: Never Used  Vaping Use  . Vaping Use: Never used  Substance and Sexual Activity  . Alcohol use: No    Alcohol/week: 0.0 standard drinks  . Drug use: No  . Sexual activity: Not  Currently    Partners: Male    Birth control/protection: None  Other Topics Concern  . Not on file  Social History Narrative   Fun: Play games   Denies abuse and feels safe at home.   Denies religious beliefs effecting health care.    Social Determinants of Health   Financial Resource Strain:   . Difficulty of Paying Living Expenses:   Food Insecurity:   . Worried About Charity fundraiser in the Last Year:   . Arboriculturist in the Last Year:   Transportation Needs:   . Film/video editor (Medical):   Marland Kitchen Lack of Transportation (Non-Medical):   Physical Activity:   . Days of Exercise per Week:   . Minutes of Exercise per Session:   Stress:   . Feeling of Stress :   Social Connections:   . Frequency of Communication with Friends and Family:   . Frequency of Social Gatherings with Friends and Family:   . Attends Religious Services:   . Active Member of Clubs or Organizations:   . Attends Archivist Meetings:   Marland Kitchen Marital Status:   Intimate Partner Violence:   . Fear of Current or Ex-Partner:   . Emotionally Abused:   Marland Kitchen Physically Abused:   . Sexually Abused:     Past Surgical History:  Procedure Laterality Date  . CESAREAN SECTION  2004  . TUBAL LIGATION      Family History  Problem Relation Age of Onset  . Asthma Mother   . Alcohol abuse Father   . Arthritis Father   . Heart disease Father   . Prostate cancer Maternal Grandfather   . Diabetes Maternal Grandfather     Allergies  Allergen Reactions  . Latex Rash  . Codeine Nausea Only    Per pt,nausea,vomiting,dizzy.        Assessment & Plan:   1. Varicose veins of both lower extremities with pain Recommend  I have reviewed my previous  discussion with the patient regarding  varicose veins and why they cause symptoms. Patient will continue  wearing graduated compression stockings class 1 on a daily basis, beginning first thing in the morning and removing them in the evening.    In  addition, behavioral modification including elevation during the day was again discussed and this will continue.  The patient has utilized over the counter pain medications and has been exercising.  However, at this time conservative therapy has not alleviated the patient's symptoms of leg pain and swelling  Recommend: laser ablation of the right and  left great saphenous veins to eliminate the symptoms of pain and swelling of the lower extremities caused by the severe superficial venous reflux disease.    2. Essential hypertension Continue antihypertensive medications as already ordered,  these medications have been reviewed and there are no changes at this time.    Current Outpatient Medications on File Prior to Visit  Medication Sig Dispense Refill  . ALBUTEROL IN Inhale into the lungs.    . fluticasone (FLONASE) 50 MCG/ACT nasal spray Place 2 sprays into both nostrils daily. 9.9 mL 3  . hydrochlorothiazide (MICROZIDE) 12.5 MG capsule TAKE 1 CAPSULE(12.5 MG) BY MOUTH DAILY (Patient not taking: Reported on 06/02/2020) 30 capsule 0   No current facility-administered medications on file prior to visit.    There are no Patient Instructions on file for this visit. No follow-ups on file.   Kris Hartmann, NP

## 2020-06-09 ENCOUNTER — Encounter: Payer: Self-pay | Admitting: Nurse Practitioner

## 2020-06-09 ENCOUNTER — Other Ambulatory Visit: Payer: Self-pay

## 2020-06-09 ENCOUNTER — Ambulatory Visit (INDEPENDENT_AMBULATORY_CARE_PROVIDER_SITE_OTHER): Payer: BC Managed Care – PPO | Admitting: Nurse Practitioner

## 2020-06-09 VITALS — BP 122/84 | HR 102 | Temp 98.7°F | Ht 63.0 in | Wt 225.0 lb

## 2020-06-09 DIAGNOSIS — I10 Essential (primary) hypertension: Secondary | ICD-10-CM

## 2020-06-09 DIAGNOSIS — D352 Benign neoplasm of pituitary gland: Secondary | ICD-10-CM

## 2020-06-09 DIAGNOSIS — Z1231 Encounter for screening mammogram for malignant neoplasm of breast: Secondary | ICD-10-CM | POA: Diagnosis not present

## 2020-06-09 DIAGNOSIS — J302 Other seasonal allergic rhinitis: Secondary | ICD-10-CM | POA: Diagnosis not present

## 2020-06-09 DIAGNOSIS — F418 Other specified anxiety disorders: Secondary | ICD-10-CM | POA: Diagnosis not present

## 2020-06-09 MED ORDER — AZELASTINE HCL 0.1 % NA SOLN
1.0000 | Freq: Two times a day (BID) | NASAL | 4 refills | Status: DC
Start: 2020-06-09 — End: 2020-10-15

## 2020-06-09 MED ORDER — HYDROCHLOROTHIAZIDE 12.5 MG PO CAPS: 12.5000 mg | ORAL_CAPSULE | Freq: Every day | ORAL | 1 refills | Status: DC | PRN

## 2020-06-09 NOTE — Patient Instructions (Addendum)
Please call and schedule your 3D mammogram as discussed.   [facility]  [address] [place], [place] [id number]  I refilled the HCTZ 12.5 mg  And the directions say daily as needed.  I prefer you to use this just a few times a week for lower leg swelling. You do not need it for your BP.   If you find that you are needing to take HCTZ 12. 5 mg medication every day, please let me know we will schedule periodic laboratory checks to monitor your kidney function.  Your blood pressure seems to be very well controlled without medication at this time.  I would continue to check it periodically and let us know if it climbs.  I have placed referral into to the allergist for your allergy concerns.  Please return in  4 mos for well woman visit Allergic Rhinitis, Adult Allergic rhinitis is an allergic reaction that affects the mucous membrane inside the nose. It causes sneezing, a runny or stuffy nose, and the feeling of mucus going down the back of the throat (postnasal drip). Allergic rhinitis can be mild to severe. There are two types of allergic rhinitis:  Seasonal. This type is also called hay fever. It happens only during certain seasons.  Perennial. This type can happen at any time of the year. What are the causes? This condition happens when the body's defense system (immune system) responds to certain harmless substances called allergens as though they were germs.  Seasonal allergic rhinitis is triggered by pollen, which can come from grasses, trees, and weeds. Perennial allergic rhinitis may be caused by:  House dust mites.  Pet dander.  Mold spores. What are the signs or symptoms? Symptoms of this condition include:  Sneezing.  Runny or stuffy nose (nasal congestion).  Postnasal drip.  Itchy nose.  Tearing of the eyes.  Trouble sleeping.  Daytime sleepiness. How is this diagnosed? This condition may be diagnosed based on:  Your medical  history.  A physical exam.  Tests to check for related conditions, such as: ? Asthma. ? Pink eye. ? Ear infection. ? Upper respiratory infection.  Tests to find out which allergens trigger your symptoms. These may include skin or blood tests. How is this treated? There is no cure for this condition, but treatment can help control symptoms. Treatment may include:  Taking medicines that block allergy symptoms, such as antihistamines. Medicine may be given as a shot, nasal spray, or pill.  Avoiding the allergen.  Desensitization. This treatment involves getting ongoing shots until your body becomes less sensitive to the allergen. This treatment may be done if other treatments do not help.  If taking medicine and avoiding the allergen does not work, new, stronger medicines may be prescribed. Follow these instructions at home:  Find out what you are allergic to. Common allergens include smoke, dust, and pollen.  Avoid the things you are allergic to. These are some things you can do to help avoid allergens: ? Replace carpet with wood, tile, or vinyl flooring. Carpet can trap dander and dust. ? Do not smoke. Do not allow smoking in your home. ? Change your heating and air conditioning filter at least once a month. ? During allergy season:  Keep windows closed as much as possible.  Plan outdoor activities when pollen counts are lowest. This is usually during the evening hours.  When coming indoors, change clothing and shower before sitting on furniture or bedding.  Take over-the-counter and prescription medicines only  as told by your health care provider.  Keep all follow-up visits as told by your health care provider. This is important. Contact a health care provider if:  You have a fever.  You develop a persistent cough.  You make whistling sounds when you breathe (you wheeze).  Your symptoms interfere with your normal daily activities. Get help right away if:  You have  shortness of breath. Summary  This condition can be managed by taking medicines as directed and avoiding allergens.  Contact your health care provider if you develop a persistent cough or fever.  During allergy season, keep windows closed as much as possible. This information is not intended to replace advice given to you by your health care provider. Make sure you discuss any questions you have with your health care provider. Document Revised: 11/11/2017 Document Reviewed: 01/06/2017 Elsevier Patient Education  2020 [employer].

## 2020-06-09 NOTE — Progress Notes (Signed)
Established Patient Office Visit  Subjective:  Patient ID: Whitney Gray, female    DOB: 08-26-1971  Age: 49 y.o. MRN: [id number]  CC:  Chief Complaint  Patient presents with  . Follow-up    HPI Whitney Gray is a 49 year old female with past medical history of amenorrhea, arthritis, heart murmur, pituitary adenoma, prolactinoma, Covid infection to/2021, allergic rhinitis, anxiety/depression,  history of sickle cell trait and mild IDA. She established care in April.  Presents for a follow- up visit.   Essential  HTN: She has had elevated blood pressure intermittent since 2014 per chart review.  She was taken off of her HCTZ 12.5 mg in 2018 because her blood pressure was too low.  She presented with blood pressure 146/92 in April. BP 186/83 in March. She was placed back on HCTZ 12.5 mg in April.  She took the medication and did well especially without lower extremity edema which she took from time to time.  She ran out of the medication and has been off of it now for several months.  She has been checking her blood pressure at home.  Her readings at home: April 149/91- 134/82-129/73-On HCTZ.   Off HCTZ 126/71-118/83-124/75  She remains normotensive.  She attributes her high blood pressure in March and April secondary to recovering from Covid.  She had a really hard time with that virus and was emotionally stressed.  BP Readings from Last 3 Encounters:  06/09/20 122/84  06/02/20 (!) 153/82  04/07/20 132/82   Allergic rhinitis: She has chronic allergy problems.  She could be driving her car and her nose gets stuffed up or starts to run.  She has stopped mowing the lawn.  She had to stop going outside the spring.  She has never had allergy testing.  She would be interested in seeing an allergist because it is affecting her quality of life.  She took Claritin briefly but had to stop it made her so sleepy.  Zyrtec does not work.  She is using Flonase every other day.  She has had to use an  inhaler in the past but no formal diagnosis of asthma.  Not active.  She had Covid in February.  Lost her sense of taste and still does not have it back.   BMI 39.86/Obesity: her wt dropped to 215 lbs.  Lost her sense of taste and  ate less. Brushed teeth with Baking soda- helped and her taste in better.   Wt Readings from Last 3 Encounters:  06/09/20 225 lb (102.1 kg)  06/02/20 226 lb 3.2 oz (102.6 kg)  04/07/20 228 lb 6.4 oz (103.6 kg)    Anxiety/depression: She tested positive for gad score of 15 and PHQ-9 of 13 in April.  We placed referral for behavioral health.  This was not completed.  She presents today, feeling better.  She thinks a lot of that had to do with her Covid infection.  She would still like to talk to a counselor.   Venous varicose: She has significant varicose veins especially in the right leg.  I gave her a referral to [facility] and they are going to be working on that.    Pituitary adenoma: She was last seen by endocrinology, Dr. Gabriel Carina  04/02/2015 she has a history of a pituitary adenoma.  She has not had follow-up since then. She says she will never go on that medication again and I guess she is talking about Cabergoline. Patient does not have records of her  most recent MRI or Prolactin level.  Patient declines any work-up related to the pituitary adenoma and does not want to see endocrinology again.   History of sickle cell trait and history of mild iron deficiency anemia. Recent laboratory studies in April showed normal hemoglobin 12.1   Past Medical History:  Diagnosis Date  . Amenorrhea   . Arthritis    Bilateral knees  . Asthma   . Depression with anxiety 03/25/2020  . Heart murmur   . Pituitary adenoma ([place])   . Prolactinoma [facility])     Past Surgical History:  Procedure Laterality Date  . CESAREAN SECTION  2004  . TUBAL LIGATION      Family History  Problem Relation Age of Onset  . Asthma Mother   . Alcohol abuse Father   . Arthritis  Father   . Heart disease Father   . Prostate cancer Maternal Grandfather   . Diabetes Maternal Grandfather     Social History   Socioeconomic History  . Marital status: Married    Spouse name: Not on file  . Number of children: 2  . Years of education: 57  . Highest education level: Not on file  Occupational History  . Occupation: Med [employer]  . Smoking status: Never Smoker  . Smokeless tobacco: Never Used  Vaping Use  . Vaping Use: Never used  Substance and Sexual Activity  . Alcohol use: No    Alcohol/week: 0.0 standard drinks  . Drug use: No  . Sexual activity: Not Currently    Partners: Male    Birth control/protection: None  Other Topics Concern  . Not on file  Social History Narrative   Fun: Play games   Denies abuse and feels safe at home.   Denies religious beliefs effecting health care.    Social Determinants of Health   Financial Resource Strain:   . Difficulty of Paying Living Expenses:   Food Insecurity:   . Worried About Charity fundraiser in the Last Year:   . Arboriculturist in the Last Year:   Transportation Needs:   . Film/video editor (Medical):   Marland Kitchen Lack of Transportation (Non-Medical):   Physical Activity:   . Days of Exercise per Week:   . Minutes of Exercise per Session:   Stress:   . Feeling of Stress :   Social Connections:   . Frequency of Communication with Friends and Family:   . Frequency of Social Gatherings with Friends and Family:   . Attends Religious Services:   . Active Member of Clubs or Organizations:   . Attends Archivist Meetings:   Marland Kitchen Marital Status:   Intimate Partner Violence:   . Fear of Current or Ex-Partner:   . Emotionally Abused:   Marland Kitchen Physically Abused:   . Sexually Abused:     Outpatient Medications Prior to Visit  Medication Sig Dispense Refill  . ALBUTEROL IN Inhale into the lungs.    . fluticasone (FLONASE) 50 MCG/ACT nasal spray Place 2 sprays into both nostrils daily. 9.9 mL  3  . hydrochlorothiazide (MICROZIDE) 12.5 MG capsule TAKE 1 CAPSULE(12.5 MG) BY MOUTH DAILY 30 capsule 0   No facility-administered medications prior to visit.    Allergies  Allergen Reactions  . Latex Rash  . Codeine Nausea Only    Per pt,nausea,vomiting,dizzy.    Review of Systems  Constitutional: Negative for activity change, appetite change, fatigue and fever.  HENT: Negative for dental problem, hearing  loss, sinus pain and sore throat.   Eyes: Negative.   Respiratory: Negative for cough and shortness of breath.   Cardiovascular: Positive for leg swelling. Negative for chest pain and palpitations.       She likes to have HCTZ for a few doses a month as needed for lower ext edema- bad varicose veins- seeing [facility].   Gastrointestinal: Negative.   Endocrine: Negative.   Genitourinary: Negative.   Musculoskeletal: Negative.   Skin: Negative.   Allergic/Immunologic: Positive for environmental allergies.  Neurological: Negative.   Psychiatric/Behavioral:       Se HPI      Objective:    Physical Exam Vitals reviewed.  Constitutional:      Appearance: Normal appearance.  HENT:     Head: Normocephalic.  Cardiovascular:     Rate and Rhythm: Normal rate and regular rhythm.     Pulses: Normal pulses.     Heart sounds: Normal heart sounds.  Pulmonary:     Effort: Pulmonary effort is normal.     Breath sounds: Normal breath sounds.  Abdominal:     Palpations: Abdomen is soft.  Musculoskeletal:        General: Normal range of motion.     Cervical back: Normal range of motion and neck supple.     Right lower leg: No edema.     Left lower leg: No edema.  Skin:    General: Skin is warm and dry.  Neurological:     General: No focal deficit present.     Mental Status: She is alert and oriented to person, place, and time.  Psychiatric:        Mood and Affect: Mood normal.        Behavior: Behavior normal.        Thought Content: Thought content normal.         Judgment: Judgment normal.     BP 122/84 (BP Location: Left Arm, Patient Position: Sitting, Cuff Size: Large)   Pulse (!) 102   Temp 98.7 F (37.1 C) (Skin)   Ht 5\' 3"  (1.6 m)   Wt 225 lb (102.1 kg)   LMP  (LMP Unknown)   SpO2 97%   BMI 39.86 kg/m  Wt Readings from Last 3 Encounters:  06/09/20 225 lb (102.1 kg)  06/02/20 226 lb 3.2 oz (102.6 kg)  04/07/20 228 lb 6.4 oz (103.6 kg)     Health Maintenance Due  Topic Date Due  . Hepatitis C Screening  Never done  . HIV Screening  Never done  . MAMMOGRAM  Never done  . COLONOSCOPY  Never done  . PAP SMEAR-Modifier  08/13/2019    There are no preventive care reminders to display for this patient.  Lab Results  Component Value Date   TSH 0.830 03/25/2020   Lab Results  Component Value Date   WBC 5.5 03/25/2020   HGB 12.1 03/25/2020   HCT 37.2 03/25/2020   MCV 83 03/25/2020   PLT 210.0 09/20/2017   Lab Results  Component Value Date   NA 141 04/08/2020   K 3.9 04/08/2020   CO2 25 04/08/2020   GLUCOSE 87 04/08/2020   BUN 13 04/08/2020   CREATININE 0.99 04/08/2020   BILITOT 0.5 03/25/2020   ALKPHOS 87 03/25/2020   AST 20 03/25/2020   ALT 16 03/25/2020   PROT 7.3 03/25/2020   ALBUMIN 4.4 03/25/2020   CALCIUM 9.5 04/08/2020   GFR 90.49 09/20/2017   Lab Results  Component Value  Date   CHOL 158 03/25/2020   Lab Results  Component Value Date   HDL 42 03/25/2020   Lab Results  Component Value Date   LDLCALC 96 03/25/2020   Lab Results  Component Value Date   TRIG 109 03/25/2020   Lab Results  Component Value Date   CHOLHDL 3.8 03/25/2020   No results found for: HGBA1C    Assessment & Plan:   Problem List Items Addressed This Visit      Cardiovascular and Mediastinum   Hypertension    BP  responds to weight loss. She had Covid in Feb, lost taste, and lost weight. Off HCTZ and normotensive.- will monitor.   She likes to have HCTZ for a few doses a month as needed for lower ext edema- bad  varicose veins- seeing [facility].       Relevant Medications   hydrochlorothiazide (MICROZIDE) 12.5 MG capsule     Endocrine   Pituitary adenoma ([place])    She declines any blood work or follow-up with this problem.         Other   Seasonal allergies - Primary    Referral to allergist per pt request. Trial of Astelin- used in  the past.       Relevant Orders   Ambulatory referral to Allergy   Encounter for screening mammogram for malignant neoplasm of breast   Relevant Orders   MM 3D SCREEN BREAST BILATERAL   Depression with anxiety    Screening GAD and PHQ much improved from April- had a hard time with Covid.  She wants to see a Social worker.       Relevant Orders   Ambulatory referral to Psychology      Meds ordered this encounter  Medications  . azelastine (ASTELIN) 0.1 % nasal spray    Sig: Place 1 spray into both nostrils 2 (two) times daily. Use in each nostril as directed    Dispense:  30 mL    Refill:  4    Order Specific Question:   Supervising Provider    Answer:   Einar Pheasant [id number]  . hydrochlorothiazide (MICROZIDE) 12.5 MG capsule    Sig: Take 1 capsule (12.5 mg total) by mouth daily as needed.    Dispense:  30 capsule    Refill:  1    Order Specific Question:   Supervising Provider    Answer:   Einar Pheasant [[id number]]    Follow-up: Return in about 4 months (around 10/09/2020).   This visit occurred during the SARS-CoV-2 public health emergency.  Safety protocols were in place, including screening questions prior to the visit, additional usage of staff PPE, and extensive cleaning of exam room while observing appropriate contact time as indicated for disinfecting solutions.   Denice Paradise, NP

## 2020-06-10 NOTE — Assessment & Plan Note (Signed)
She declines any blood work or follow-up with this problem.

## 2020-06-10 NOTE — Assessment & Plan Note (Addendum)
Referral to allergist per pt request. Trial of Astelin- used in  the past.

## 2020-06-10 NOTE — Assessment & Plan Note (Signed)
BP  responds to weight loss. She had Covid in Feb, lost taste, and lost weight. Off HCTZ and normotensive.- will monitor.   She likes to have HCTZ for a few doses a month as needed for lower ext edema- bad varicose veins- seeing [facility].

## 2020-06-10 NOTE — Assessment & Plan Note (Signed)
Screening GAD and PHQ much improved from April- had a hard time with Covid.  She wants to see a Social worker.

## 2020-06-11 ENCOUNTER — Telehealth: Payer: Self-pay | Admitting: Nurse Practitioner

## 2020-06-11 ENCOUNTER — Encounter (INDEPENDENT_AMBULATORY_CARE_PROVIDER_SITE_OTHER): Payer: Self-pay

## 2020-06-11 NOTE — Telephone Encounter (Signed)
Pt called in and stated that she left her bluetooth device it is green and black and would like a call back so she can pick it .

## 2020-06-11 NOTE — Telephone Encounter (Signed)
I have not seen any kind of devices in my rooms; her appt was two days ago. Maybe she left it somewhere else? I tried to call patient but her voicemail is full and I could not leave her a message.

## 2020-06-12 NOTE — Telephone Encounter (Signed)
Spoke with patient and advised her we have not seen any devices in the rooms nor has one been found and taken to the front staff.

## 2020-08-11 ENCOUNTER — Ambulatory Visit (HOSPITAL_COMMUNITY)
Admission: EM | Admit: 2020-08-11 | Discharge: 2020-08-11 | Disposition: A | Discharge status: Home / Self Care | Payer: BC Managed Care – PPO | Attending: Family Medicine | Admitting: Family Medicine

## 2020-08-11 ENCOUNTER — Encounter (HOSPITAL_COMMUNITY): Payer: Self-pay | Admitting: Emergency Medicine

## 2020-08-11 ENCOUNTER — Other Ambulatory Visit: Payer: Self-pay

## 2020-08-11 DIAGNOSIS — R1011 Right upper quadrant pain: Secondary | ICD-10-CM

## 2020-08-11 MED ORDER — ONDANSETRON 4 MG PO TBDP
4.0000 mg | ORAL_TABLET | Freq: Three times a day (TID) | ORAL | 0 refills | Status: DC | PRN
Start: 2020-08-11 — End: 2021-08-03

## 2020-08-11 NOTE — Discharge Instructions (Addendum)

## 2020-08-11 NOTE — ED Triage Notes (Signed)
Pt c/o abd pain onset this morning around 10 am. Pt states this was an upper/epigastric pain pain. Pt states she drank a whole bottle of water and had a bowel movement. She states after she stood up she felt nauseous and vomited. Pt states now the pain is better.

## 2020-08-13 NOTE — ED Provider Notes (Signed)
[name]   [id number] 08/11/20 Arrival Time: 4163  ASSESSMENT & PLAN:  1. Right upper quadrant abdominal pain     Question cholelithiasis as she has been dx in the past. Benign abdominal exam. No indications for urgent abdominal/pelvic imaging at this time. Discussed. NO signs of dehydration. She is comfortable with home observation.   Meds ordered this encounter  Medications  . ondansetron (ZOFRAN-ODT) 4 MG disintegrating tablet    Sig: Take 1 tablet (4 mg total) by mouth every 8 (eight) hours as needed for nausea or vomiting.    Dispense:  15 tablet    Refill:  0     Discharge Instructions     You have been seen today for abdominal pain. Your evaluation was not suggestive of any emergent condition requiring medical intervention at this time. However, some abdominal problems make take more time to appear. Therefore, it is very important for you to pay attention to any new symptoms or worsening of your current condition.  Please return here or to the Emergency Department immediately should you begin to feel worse in any way or have any of the following symptoms: increasing or different abdominal pain, persistent vomiting, inability to drink fluids, fevers, or shaking chills.      Follow-up Information    Whitney Gray.   Specialty: Emergency Medicine Why: If symptoms worsen in any way. Contact information: [address] [id number] [place] [phone]               Reviewed expectations re: course of current medical issues. Questions answered. Outlined signs and symptoms indicating need for more acute intervention. Patient verbalized understanding. After Visit Summary given.   SUBJECTIVE: History from: patient. Whitney Gray is a 49 y.o. female who presents with complaint of intermittent RUQ abdominal discomfort and midline. Onset abrupt, today. Discomfort described as  aching; radiates to right back; does not wake her at night. Reports normal flatus. Symptoms are much better since beginning. Fever: absent. Aggravating factors: have not been identified. Alleviating factors: have not been identified. Associated symptoms: mild nausea without one emesis. She denies constipation, diarrhea and fever. Appetite: normal. PO intake: normal. Ambulatory without assistance. Urinary symptoms: none.  History of similar: yes; with gallstones "but not as bad as this morning". OTC treatment: none.  No LMP recorded (lmp unknown). Patient is postmenopausal.   Past Surgical History:  Procedure Laterality Date  . CESAREAN SECTION  2004  . TUBAL LIGATION       OBJECTIVE:  Vitals:   08/11/20 1326  BP: (!) 158/94  Pulse: 67  Resp: 16  Temp: 98.4 F (36.9 C)  TempSrc: Oral  SpO2: 100%    General appearance: alert, oriented, no acute distress HEENT: [place]; AT; oropharynx moist Lungs: unlabored respirations Abdomen: soft; without distention; mild  and poorly localized tenderness to palpation over RUQ; normal bowel sounds; without masses or organomegaly; without guarding or rebound tenderness Back: without reported CVA tenderness; FROM at waist Extremities: without LE edema; symmetrical; without gross deformities Skin: warm and dry Neurologic: normal gait Psychological: alert and cooperative; normal mood and affect  Allergies  Allergen Reactions  . Latex Rash  . Codeine Nausea Only    Per pt,nausea,vomiting,dizzy.  Past Medical History:  Diagnosis Date  . Amenorrhea   . Arthritis    Bilateral knees  . Asthma   . Depression with anxiety 03/25/2020  . Heart murmur   . Pituitary adenoma ([place])   . Prolactinoma [facility])     Social History   Socioeconomic History  . Marital status: Married    Spouse name: Not on file  . Number of children: 2  . Years of education: 63  . Highest education level: Not on file    Occupational History  . Occupation: Med [employer]  . Smoking status: Never Smoker  . Smokeless tobacco: Never Used  Vaping Use  . Vaping Use: Never used  Substance and Sexual Activity  . Alcohol use: No    Alcohol/week: 0.0 standard drinks  . Drug use: No  . Sexual activity: Not Currently    Partners: Male    Birth control/protection: None  Other Topics Concern  . Not on file  Social History Narrative   Fun: Play games   Denies abuse and feels safe at home.   Denies religious beliefs effecting health care.    Social Determinants of Health   Financial Resource Strain:   . Difficulty of Paying Living Expenses: Not on file  Food Insecurity:   . Worried About Charity fundraiser in the Last Year: Not on file  . Ran Out of Food in the Last Year: Not on file  Transportation Needs:   . Lack of Transportation (Medical): Not on file  . Lack of Transportation (Non-Medical): Not on file  Physical Activity:   . Days of Exercise per Week: Not on file  . Minutes of Exercise per Session: Not on file  Stress:   . Feeling of Stress : Not on file  Social Connections:   . Frequency of Communication with Friends and Family: Not on file  . Frequency of Social Gatherings with Friends and Family: Not on file  . Attends Religious Services: Not on file  . Active Member of Clubs or Organizations: Not on file  . Attends Archivist Meetings: Not on file  . Marital Status: Not on file  Intimate Partner Violence:   . Fear of Current or Ex-Partner: Not on file  . Emotionally Abused: Not on file  . Physically Abused: Not on file  . Sexually Abused: Not on file    Family History  Problem Relation Age of Onset  . Asthma Mother   . Alcohol abuse Father   . Arthritis Father   . Heart disease Father   . Prostate cancer Maternal Grandfather   . Diabetes Maternal Reymundo Poll, MD 08/13/20 [phone]

## 2020-08-19 ENCOUNTER — Encounter: Payer: Self-pay | Admitting: Nurse Practitioner

## 2020-09-06 ENCOUNTER — Encounter (INDEPENDENT_AMBULATORY_CARE_PROVIDER_SITE_OTHER): Payer: Self-pay

## 2020-10-15 ENCOUNTER — Encounter: Payer: Self-pay | Admitting: Allergy

## 2020-10-15 ENCOUNTER — Ambulatory Visit (INDEPENDENT_AMBULATORY_CARE_PROVIDER_SITE_OTHER): Payer: BC Managed Care – PPO | Admitting: Allergy

## 2020-10-15 ENCOUNTER — Other Ambulatory Visit: Payer: Self-pay

## 2020-10-15 VITALS — BP 128/86 | HR 69 | Temp 98.3°F | Resp 14 | Ht 63.0 in | Wt 243.0 lb

## 2020-10-15 DIAGNOSIS — J4520 Mild intermittent asthma, uncomplicated: Secondary | ICD-10-CM | POA: Diagnosis not present

## 2020-10-15 DIAGNOSIS — H1013 Acute atopic conjunctivitis, bilateral: Secondary | ICD-10-CM | POA: Diagnosis not present

## 2020-10-15 DIAGNOSIS — J3089 Other allergic rhinitis: Secondary | ICD-10-CM | POA: Diagnosis not present

## 2020-10-15 MED ORDER — AZELASTINE-FLUTICASONE 137-50 MCG/ACT NA SUSP
1.0000 | Freq: Two times a day (BID) | NASAL | 5 refills | Status: DC
Start: 2020-10-15 — End: 2021-08-26

## 2020-10-15 NOTE — Patient Instructions (Addendum)
Allergic rhinitis with conjunctivitis -Environmental allergy skin testing is positive to grasses, weeds, trees, molds, dust mites, mouse.  -Allergen avoidance measures discussed/handouts provided -Try Xyzal 5 mg daily.  This is a long-acting over-the-counter antihistamine in the same category as Zyrtec, Claritin and Allegra.  You may find this to be more effective than these other options.  This has one of the least likelihoods of causing sedation however recommend taking first dose at a time where you do not have to do any thing so you can observe if you become drowsy. -Trial dymista 1 spray each nostril twice a day.  This is a combination nasal spray with Flonase + Astelin (nasal antihistamine).  This helps with both nasal congestion and drainage.  -For itchy, watery eyes can use over-the-counter Pataday 1 drop each eye daily as needed -Allergen immunotherapy discussed today including protocol, benefits and risk.  Informational handout provided.  If interested in this therapuetic option you can check with your insurance carrier for coverage.  Let us know if you would like to proceed with this option.    Well-controlled mild intermittent asthma -lung function today is normal -Have access to albuterol inhaler 2 puffs every 4-6 hours as needed for cough/wheeze/shortness of breath/chest tightness.  May use 15-20 minutes prior to activity.   Monitor frequency of use.    Control goals:   Full participation in all desired activities (may need albuterol before activity)  Albuterol use two time or less a week on average (not counting use with activity)  Cough interfering with sleep two time or less a month  Oral steroids no more than once a year  No hospitalizations  Follow-up in 3-4 months or sooner if needed

## 2020-10-15 NOTE — Progress Notes (Signed)
New Patient Note  RE: Whitney Gray MRN: [id number] DOB: 25-Aug-1971 Date of Office Visit: 10/15/2020  Referring provider: Marval Regal, NP Primary care provider: Marval Regal, NP  Chief Complaint: allergies  History of present illness: Whitney Gray is a 49 y.o. female presenting today for consultation for allergies.   She reports having allergies over the past 20 years which has worsened over the past 2 years.  She states the symptoms can be triggered at any given moment.  She does states she has more symptoms inside the home.  She also states she has symptoms at work now.   Sneezing, hoarseness, watery eyes, nasal congestion, runny nose, throat clearing.  She has used claritin that was helpful but was making her drowsy.  Allegra and zyrtec also made her sleepy.  Has not tried singulair or used any eye drops before.  She has tried nasal saline rinse but didn't tolerate use.   She states since June she has had issues with her sense of smell.  She had Covid in March 2021 and she did lose her sense of smell.  She states it slowly returned in April and May.  She states she was using afrin however her PCP recommend use of Nasonex but states her sense of smell worsened.  She stopped using the nasonex but states her sense of smell is still off.  She states coffee, chocolate all smell the same and bad.   She reports getting sinus infection about twice a year that is treated with 1 round of antibiotics.    She has an albuterol inhaler and hasn't needed to use in about 8 months or so.  She states her symptoms are mild.  She will notice coughing primarily.  Cold morning, walking briskly can trigger symptoms.  She has not required systemic steroids or hospitalization.   No history of food allergy or eczema.     Review of systems: Review of Systems  Constitutional: Negative.   HENT:       See HPI  Eyes:       See HPI  Respiratory: Negative.   Cardiovascular: Negative.     Gastrointestinal: Negative.   Musculoskeletal: Negative.   Skin: Negative.   Neurological: Negative.     All other systems negative unless noted above in HPI  Past medical history: Past Medical History:  Diagnosis Date  . Amenorrhea   . Arthritis    Bilateral knees  . Asthma   . Depression with anxiety 03/25/2020  . Heart murmur   . Pituitary adenoma ([place])   . Prolactinoma [facility])     Past surgical history: Past Surgical History:  Procedure Laterality Date  . CESAREAN SECTION  2004  . TUBAL LIGATION      Family history:  Family History  Problem Relation Age of Onset  . Asthma Mother   . Alcohol abuse Father   . Arthritis Father   . Heart disease Father   . Prostate cancer Maternal Grandfather   . Diabetes Maternal Grandfather     Social history: Lives in a home with carpeting in the bedroom with electric heating and central cooling.  No pets in the home.  There is concern for water damage or mildew in the home.  No concerns for roaches.  She has a Librarian, academic.  She denies a smoking history.  Medication List: Current Outpatient Medications  Medication Sig Dispense Refill  . ALBUTEROL IN Inhale into the lungs.    . hydrochlorothiazide (MICROZIDE) 12.5 MG  capsule Take 1 capsule (12.5 mg total) by mouth daily as needed. 30 capsule 1  . azelastine (ASTELIN) 0.1 % nasal spray Place 1 spray into both nostrils 2 (two) times daily. Use in each nostril as directed (Patient not taking: Reported on 10/15/2020) 30 mL 4  . fluticasone (FLONASE) 50 MCG/ACT nasal spray Place 2 sprays into both nostrils daily. (Patient not taking: Reported on 10/15/2020) 9.9 mL 3  . ondansetron (ZOFRAN-ODT) 4 MG disintegrating tablet Take 1 tablet (4 mg total) by mouth every 8 (eight) hours as needed for nausea or vomiting. (Patient not taking: Reported on 10/15/2020) 15 tablet 0   No current facility-administered medications for this visit.    Known medication allergies: Allergies  Allergen  Reactions  . Latex Rash  . Codeine Nausea Only    Per pt,nausea,vomiting,dizzy.      Physical examination: Blood pressure 128/86, pulse 69, temperature 98.3 F (36.8 C), resp. rate 14, height 5\' 3"  (1.6 m), weight 243 lb (110.2 kg), SpO2 98 %.  General: Alert, interactive, in no acute distress. HEENT: PERRLA, TMs pearly gray, turbinates moderately edematous without discharge, post-pharynx non erythematous. Neck: Supple without lymphadenopathy. Lungs: Clear to auscultation without wheezing, rhonchi or rales. {no increased work of breathing. CV: Normal S1, S2 without murmurs. Abdomen: Nondistended, nontender. Skin: Warm and dry, without lesions or rashes. Extremities:  No clubbing, cyanosis or edema. Neuro:   Grossly intact.  Diagnositics/Labs:  Spirometry: FEV1: 2.0L 90%, FVC: 2.48 L 90%, ratio consistent with Nonobstructive pattern  Allergy testing: Environmental allergy skin prick testing is positive to grass pollens, tree weed pollens, tree pollens, molds, both dust mites and mouse. Allergy testing results were read and interpreted by provider, documented by clinical staff.   Assessment and plan:   Allergic rhinitis with conjunctivitis -Environmental allergy skin testing is positive to grasses, weeds, trees, molds, dust mites, mouse.  -Allergen avoidance measures discussed/handouts provided -Try Xyzal 5 mg daily.  This is a long-acting over-the-counter antihistamine in the same category as Zyrtec, Claritin and Allegra.  You may find this to be more effective than these other options.  This has one of the least likelihoods of causing sedation however recommend taking first dose at a time where you do not have to do any thing so you can observe if you become drowsy. -Trial dymista 1 spray each nostril twice a day.  This is a combination nasal spray with Flonase + Astelin (nasal antihistamine).  This helps with both nasal congestion and drainage.  -For itchy, watery eyes can use  over-the-counter Pataday 1 drop each eye daily as needed -Allergen immunotherapy discussed today including protocol, benefits and risk.  Informational handout provided.  If interested in this therapuetic option you can check with your insurance carrier for coverage.  Let us know if you would like to proceed with this option.   -If smell does not improve with the use of nasal sprays as above and will consider ENT versus neurology for further evaluation  Well-controlled mild intermittent asthma -lung function today is normal -Have access to albuterol inhaler 2 puffs every 4-6 hours as needed for cough/wheeze/shortness of breath/chest tightness.  May use 15-20 minutes prior to activity.   Monitor frequency of use.    Control goals:   Full participation in all desired activities (may need albuterol before activity)  Albuterol use two time or less a week on average (not counting use with activity)  Cough interfering with sleep two time or less a month  Oral steroids no  more than once a year  No hospitalizations  Follow-up in 3-4 months or sooner if needed I appreciate the opportunity to take part in Corrisa's care. Please do not hesitate to contact me with questions.  Sincerely,   Prudy Feeler, MD Allergy/Immunology [facility]

## 2020-11-18 ENCOUNTER — Telehealth: Payer: Self-pay | Admitting: Nurse Practitioner

## 2020-11-18 NOTE — Telephone Encounter (Signed)
Patient called in stating that she was a patient at [facility] and that her PCP was no longer at the practice. She had mentioned possibly transferring to our office. She was requesting to be seen asap in our office due to some issues she was having. She stated that she has had nausea, vomitting, and severe abdominal pain. Not able to eat without getting sick. Attempted to transfer to access nursing to assist patient. They would not take the call. Was able to get a triage nurse to discuss what was going on with her. EM

## 2020-11-18 NOTE — Telephone Encounter (Signed)
Spoke to patient and was advised that she has been having abdominal pain, nausea and vomiting for several months. Patient stated that her symptoms got worse last week. Patient stated that she has gallstones. Patient stated that she called [facility] last week and was advised that they could not see her because they do not have any providers that are  taking new patients. Patient stated that she was advised to call [facility] and see about getting in here to see a provider. Patient stated that she was hoping that she could be seen shortly as a new patient. Spoke to our team lead and was advised to call [facility] and let them know that this patient would not be a new patient but a transfer of care. I spoke to Abernathy.at [facility] to see  if they were doing transfer of care appointments since we do not have anything available for a new patient any time soon.  Caryl Pina at [facility] stated that they do not have any providers available to take transfer of care patients. Caryl Pina stated that they are referring patient's to our office and to [facility]. Riccardo Dubin that patient stated that she called their office last week about scheduling an appointment there, but there is not any documentation in her records. Caryl Pina stated that they always document in the patient's records but nothing documented from a call last week. I was not aware of [facility] not having any providers taking transfer of care patients.  Got back on the phone and advised the patient with her symptoms she should go to an UC or ER.  Advised patient that the UC would be limited on what they can do regarding her gallstones.   Patient was advised if she goes to the ER and has gallstones they will refer her to a surgeon. Advised patient that we will be glad to see her as a new patient but it will be probably February before we can get her in. Patient was offered the telephone number for  [facility], but patient declined stating that she may see about going to a [facility] office in [place]. Patient appreciated the advice giving to her and the time spent with her on the phone.

## 2020-11-20 ENCOUNTER — Encounter (INDEPENDENT_AMBULATORY_CARE_PROVIDER_SITE_OTHER): Payer: Self-pay | Admitting: Vascular Surgery

## 2020-11-20 ENCOUNTER — Ambulatory Visit (INDEPENDENT_AMBULATORY_CARE_PROVIDER_SITE_OTHER): Payer: BC Managed Care – PPO | Admitting: Vascular Surgery

## 2020-11-20 ENCOUNTER — Other Ambulatory Visit: Payer: Self-pay

## 2020-11-20 VITALS — BP 154/81 | HR 75 | Resp 16 | Wt 230.0 lb

## 2020-11-20 DIAGNOSIS — I83813 Varicose veins of bilateral lower extremities with pain: Secondary | ICD-10-CM | POA: Diagnosis not present

## 2020-11-20 NOTE — Progress Notes (Signed)
    MRN : [id number]  Whitney Gray is a 49 y.o. ([birth year]) female who presents with chief complaint of painful varicose veins.    The patient's right lower extremity was sterilely prepped and draped.  The ultrasound machine was used to visualize the right great saphenous vein throughout its course.  A segment below the knee was selected for access.  The saphenous vein was accessed without difficulty using ultrasound guidance with a micropuncture needle.   An 0.018  wire was placed beyond the saphenofemoral junction through the sheath and the microneedle was removed.  The 65 cm sheath was then placed over the wire and the wire and dilator were removed.  The laser fiber was placed through the sheath and its tip was placed approximately 2 cm below the saphenofemoral junction.  Tumescent anesthesia was then created with a dilute lidocaine solution.  Laser energy was then delivered with constant withdrawal of the sheath and laser fiber.  Approximately 1990 Joules of energy were delivered over a length of 48 cm.  Sterile dressings were placed.  The patient tolerated the procedure well without complications.

## 2020-11-24 ENCOUNTER — Encounter (INDEPENDENT_AMBULATORY_CARE_PROVIDER_SITE_OTHER): Payer: BC Managed Care – PPO

## 2020-11-24 ENCOUNTER — Other Ambulatory Visit (INDEPENDENT_AMBULATORY_CARE_PROVIDER_SITE_OTHER): Payer: Self-pay | Admitting: Vascular Surgery

## 2020-11-24 DIAGNOSIS — I83813 Varicose veins of bilateral lower extremities with pain: Secondary | ICD-10-CM

## 2020-12-01 ENCOUNTER — Telehealth: Payer: Self-pay

## 2020-12-01 NOTE — Telephone Encounter (Signed)
Patient would like transfer care to [facility].  Please advise

## 2020-12-08 ENCOUNTER — Telehealth (INDEPENDENT_AMBULATORY_CARE_PROVIDER_SITE_OTHER): Payer: Self-pay

## 2020-12-08 NOTE — Telephone Encounter (Signed)
Patient left a voicemail stating that she had laser on 11/20/20 (right gsv) and at first she wasn't having any pain but last week her leg became very tender. The following week the patient started having a lot pain and was taking bc powder because it with help with the pain faster. The patient has went to urgent care because she was not able to bare any weight on the right leg and was told that she had a inflammation. The patient was thinking maybe she took a bath to soon because that when the pain started. I informed the patient that tenderness and pain was to be expected and she should alternate tylenol and ibuprofen for pain relief.The patient was informed if pain does not improve she should contact the office.

## 2020-12-14 ENCOUNTER — Telehealth: Payer: Self-pay | Admitting: Internal Medicine

## 2020-12-14 NOTE — Telephone Encounter (Signed)
Received notification that pt was overdue mammogram.  This is one of Whitney Gray's former pts.  Appears she messaged and stated she wanted to establish care at [facility].  Please notify needs mammogram.  Thanks

## 2020-12-16 ENCOUNTER — Ambulatory Visit: Payer: BC Managed Care – PPO | Admitting: Nurse Practitioner

## 2020-12-16 NOTE — Telephone Encounter (Signed)
Selena Batten is no longer with our practice. Pt would like to get scheduled at [facility].

## 2020-12-16 NOTE — Telephone Encounter (Signed)
Left detailed message for patient.

## 2020-12-17 NOTE — Progress Notes (Signed)
    MRN : [id number]  Nakiya Rallis is a 50 y.o. (February 08, 1971) female who presents with chief complaint of No chief complaint on file. .    The patient's left lower extremity was sterilely prepped and draped.  The ultrasound machine was used to visualize the left great saphenous vein throughout its course.  A segment at the knee was selected for access.  The saphenous vein was accessed without difficulty using ultrasound guidance with a micropuncture needle.   An 0.018  wire was placed beyond the saphenofemoral junction through the sheath and the microneedle was removed.  The 65 cm sheath was then placed over the wire and the wire and dilator were removed.  The laser fiber was placed through the sheath and its tip was placed approximately 2 cm below the saphenofemoral junction.  Tumescent anesthesia was then created with a dilute lidocaine solution.  Laser energy was then delivered with constant withdrawal of the sheath and laser fiber.  Approximately 1457 Joules of energy were delivered over a length of 34 cm.  Sterile dressings were placed.  The patient tolerated the procedure well without complications.

## 2020-12-18 ENCOUNTER — Ambulatory Visit (INDEPENDENT_AMBULATORY_CARE_PROVIDER_SITE_OTHER): Payer: BC Managed Care – PPO | Admitting: Vascular Surgery

## 2020-12-18 ENCOUNTER — Encounter (INDEPENDENT_AMBULATORY_CARE_PROVIDER_SITE_OTHER): Payer: Self-pay | Admitting: Vascular Surgery

## 2020-12-18 ENCOUNTER — Other Ambulatory Visit: Payer: Self-pay

## 2020-12-18 VITALS — BP 136/74 | HR 78 | Ht 63.0 in | Wt 226.0 lb

## 2020-12-18 DIAGNOSIS — I83813 Varicose veins of bilateral lower extremities with pain: Secondary | ICD-10-CM | POA: Diagnosis not present

## 2020-12-20 LAB — HM DIABETES EYE EXAM

## 2020-12-22 ENCOUNTER — Encounter (INDEPENDENT_AMBULATORY_CARE_PROVIDER_SITE_OTHER): Payer: BC Managed Care – PPO

## 2020-12-22 ENCOUNTER — Encounter (INDEPENDENT_AMBULATORY_CARE_PROVIDER_SITE_OTHER): Payer: Self-pay

## 2020-12-25 ENCOUNTER — Other Ambulatory Visit: Payer: Self-pay

## 2020-12-25 ENCOUNTER — Other Ambulatory Visit (INDEPENDENT_AMBULATORY_CARE_PROVIDER_SITE_OTHER): Payer: Self-pay | Admitting: Vascular Surgery

## 2020-12-25 ENCOUNTER — Ambulatory Visit (INDEPENDENT_AMBULATORY_CARE_PROVIDER_SITE_OTHER): Payer: BC Managed Care – PPO

## 2020-12-25 DIAGNOSIS — I83813 Varicose veins of bilateral lower extremities with pain: Secondary | ICD-10-CM

## 2020-12-25 DIAGNOSIS — Z9889 Other specified postprocedural states: Secondary | ICD-10-CM

## 2021-01-07 NOTE — Telephone Encounter (Signed)
Hi Dr. Nelva Bush. I hope you are well. I'd like to cancel any future appointments since I can't afford them. My last visit was fairly short, and although my symptoms still persist, the bill was more than $1000 because each thing done was itemized separately as if they had happened on separate visits.   I was disappointed by this because it totally ruins my entire medical budget for 2022. I would like to cancel my February 2 visit, and any future appointments because they are too expensive for me. I'm going to pay this bill, but I won't be making any new ones. Thank you!    Best regards, Whitney Gray  I will cancel her upcoming appointments if someone could look into this for her and reach out to her thank you

## 2021-01-14 ENCOUNTER — Ambulatory Visit: Payer: BC Managed Care – PPO | Admitting: Allergy

## 2021-01-19 ENCOUNTER — Emergency Department (HOSPITAL_COMMUNITY): Payer: BC Managed Care – PPO

## 2021-01-19 ENCOUNTER — Other Ambulatory Visit: Payer: Self-pay

## 2021-01-19 ENCOUNTER — Encounter (HOSPITAL_COMMUNITY): Payer: Self-pay

## 2021-01-19 ENCOUNTER — Emergency Department (HOSPITAL_COMMUNITY)
Admission: EM | Admit: 2021-01-19 | Discharge: 2021-01-20 | Disposition: A | Discharge status: Home / Self Care | Payer: BC Managed Care – PPO | Attending: Emergency Medicine | Admitting: Emergency Medicine

## 2021-01-19 ENCOUNTER — Ambulatory Visit (INDEPENDENT_AMBULATORY_CARE_PROVIDER_SITE_OTHER): Payer: BC Managed Care – PPO | Admitting: Vascular Surgery

## 2021-01-19 ENCOUNTER — Encounter (INDEPENDENT_AMBULATORY_CARE_PROVIDER_SITE_OTHER): Payer: Self-pay | Admitting: Vascular Surgery

## 2021-01-19 ENCOUNTER — Ambulatory Visit (HOSPITAL_COMMUNITY)
Admission: EM | Admit: 2021-01-19 | Discharge: 2021-01-19 | Disposition: A | Discharge status: Home / Self Care | Payer: BC Managed Care – PPO | Attending: Internal Medicine | Admitting: Internal Medicine

## 2021-01-19 ENCOUNTER — Encounter (HOSPITAL_COMMUNITY): Payer: Self-pay | Admitting: *Deleted

## 2021-01-19 VITALS — BP 152/86 | HR 80 | Resp 16 | Wt 225.8 lb

## 2021-01-19 DIAGNOSIS — R079 Chest pain, unspecified: Secondary | ICD-10-CM | POA: Diagnosis not present

## 2021-01-19 DIAGNOSIS — R002 Palpitations: Secondary | ICD-10-CM | POA: Diagnosis not present

## 2021-01-19 DIAGNOSIS — I10 Essential (primary) hypertension: Secondary | ICD-10-CM

## 2021-01-19 DIAGNOSIS — R9431 Abnormal electrocardiogram [ECG] [EKG]: Secondary | ICD-10-CM

## 2021-01-19 DIAGNOSIS — I83813 Varicose veins of bilateral lower extremities with pain: Secondary | ICD-10-CM

## 2021-01-19 DIAGNOSIS — Z5321 Procedure and treatment not carried out due to patient leaving prior to being seen by health care provider: Secondary | ICD-10-CM | POA: Diagnosis not present

## 2021-01-19 DIAGNOSIS — R519 Headache, unspecified: Secondary | ICD-10-CM | POA: Diagnosis not present

## 2021-01-19 DIAGNOSIS — R0789 Other chest pain: Secondary | ICD-10-CM | POA: Diagnosis not present

## 2021-01-19 LAB — BASIC METABOLIC PANEL
Anion gap: 8 (ref 5–15)
BUN: 11 mg/dL (ref 6–20)
CO2: 26 mmol/L (ref 22–32)
Calcium: 9.1 mg/dL (ref 8.9–10.3)
Chloride: 104 mmol/L (ref 98–111)
Creatinine, Ser: 0.83 mg/dL (ref 0.44–1.00)
GFR, Estimated: >60 mL/min (ref >60)
Glucose, Bld: 104 mg/dL — ABNORMAL HIGH (ref 70–99)
Potassium: 3.9 mmol/L (ref 3.5–5.1)
Sodium: 138 mmol/L (ref 135–145)

## 2021-01-19 LAB — CBC
HCT: 37.2 % (ref 36.0–46.0)
Hemoglobin: 11.3 g/dL — ABNORMAL LOW (ref 12.0–15.0)
MCH: 26.4 pg (ref 26.0–34.0)
MCHC: 30.4 g/dL (ref 30.0–36.0)
MCV: 86.9 fL (ref 80.0–100.0)
Platelets: 231 10*3/uL (ref 150–400)
RBC: 4.28 MIL/uL (ref 3.87–5.11)
RDW: 15.6 % — ABNORMAL HIGH (ref 11.5–15.5)
WBC: 6.4 10*3/uL (ref 4.0–10.5)
nRBC: 0 % (ref 0.0–0.2)

## 2021-01-19 LAB — TROPONIN I (HIGH SENSITIVITY)
Troponin I (High Sensitivity): 3 ng/L (ref <18)
Troponin I (High Sensitivity): 3 ng/L (ref <18)

## 2021-01-19 LAB — I-STAT BETA HCG BLOOD, ED (MC, WL, AP ONLY): I-stat hCG, quantitative: <5 m[IU]/mL (ref <5)

## 2021-01-19 NOTE — ED Provider Notes (Signed)
Whitney Gray    CSN: [id number] Arrival date & time: 01/19/21  1437      History   Chief Complaint Chief Complaint  Patient presents with  . Chest Pain    HPI Whitney Gray is a 50 y.o. female presenting with intermittent central chest discomfort for 1 week. History of asthma, arthritis, hypertension, depression, heart murmur per patient. She states that for the past week she has had random episodes of central chest discomfort radiating to the left side of her chest. Chest pain occurs randomly, and she has not identified any triggers. also endorses episodes of left arm pain. Also endorses episodes of palpitations that resolve on their own. Intermittent nausea. Denies headaches, vision changes, loss of consciousness. This patient takes hydrochlorothiazide for hypertension, and states that this is typically well controlled on this medication. States history of heart murmur, and it has never given her any issues. Denies history of other cardiac issues  HPI  Past Medical History:  Diagnosis Date  . Amenorrhea   . Arthritis    Bilateral knees  . Asthma   . Depression with anxiety 03/25/2020  . Heart murmur   . Pituitary adenoma ([place])   . Prolactinoma [facility])     Patient Active Problem List   Diagnosis Date Noted  . Varicose veins of both lower extremities with pain 11/20/2020  . Depression with anxiety 03/25/2020  . Encounter for screening mammogram for malignant neoplasm of breast 09/20/2017  . Hypertension 04/05/2017  . Dandruff 07/10/2015  . Seasonal allergies 07/10/2015  . Pituitary adenoma ([place]) 07/10/2015  . Obesity 07/10/2015  . Allergic rhinitis 03/13/2015  . Sickle cell trait ([place]) 02/14/2015  . Vitamin D insufficiency 02/14/2015    Past Surgical History:  Procedure Laterality Date  . CESAREAN SECTION  2004  . TUBAL LIGATION      OB History   No obstetric history on file.      Home Medications    Prior to Admission medications   Medication Sig  Start Date End Date Taking? Authorizing Provider  ALBUTEROL IN Inhale into the lungs as needed.    [provider]  ALPRAZolam Duanne Moron) 0.5 MG tablet Take 0.5 mg by mouth 2 (two) times daily as needed. Patient not taking: Reported on 01/19/2021 12/17/20   [provider]  Azelastine-Fluticasone (DYMISTA) 137-50 MCG/ACT SUSP Place 1 spray into both nostrils in the morning and at bedtime. Patient not taking: Reported on 01/19/2021 10/15/20   Kennith Gain, MD  fluticasone [facility]) 50 MCG/ACT nasal spray Place 2 sprays into both nostrils daily. Patient not taking: Reported on 01/19/2021 03/24/20   Marval Regal, NP  hydrochlorothiazide (MICROZIDE) 12.5 MG capsule Take 1 capsule (12.5 mg total) by mouth daily as needed. 06/09/20   Marval Regal, NP  ondansetron (ZOFRAN-ODT) 4 MG disintegrating tablet Take 1 tablet (4 mg total) by mouth every 8 (eight) hours as needed for nausea or vomiting. Patient not taking: Reported on 01/19/2021 08/11/20   Vanessa Kick, MD    Family History Family History  Problem Relation Age of Onset  . Asthma Mother   . Alcohol abuse Father   . Arthritis Father   . Heart disease Father   . Prostate cancer Maternal Grandfather   . Diabetes Maternal Grandfather     Social History Social History   Tobacco Use  . Smoking status: Never Smoker  . Smokeless tobacco: Never Used  Vaping Use  . Vaping Use: Never used  Substance Use Topics  .  Alcohol use: No    Alcohol/week: 0.0 standard drinks  . Drug use: No     Allergies   Latex and Codeine   Review of Systems Review of Systems  Respiratory: Positive for chest tightness. Negative for cough, shortness of breath and wheezing.   Cardiovascular: Positive for chest pain and palpitations. Negative for leg swelling.  All other systems reviewed and are negative.    Physical Exam Triage Vital Signs ED Triage Vitals  Enc Vitals Group     BP 01/19/21 1627 (!) 175/98     Pulse Rate  01/19/21 1627 81     Resp 01/19/21 1627 18     Temp 01/19/21 1627 98.6 F (37 C)     Temp Source 01/19/21 1627 Oral     SpO2 01/19/21 1627 95 %     Weight --      Height --      Head Circumference --      Peak Flow --      Pain Score 01/19/21 1626 8     Pain Loc --      Pain Edu? --      Excl. in [place]? --    No data found.  Updated Vital Signs BP (!) 144/89 (BP Location: Left Arm)   Pulse 81   Temp 98.6 F (37 C) (Oral)   Resp 18   LMP  (LMP Unknown)   SpO2 95%   Visual Acuity Right Eye Distance:   Left Eye Distance:   Bilateral Distance:    Right Eye Near:   Left Eye Near:    Bilateral Near:     Physical Exam Vitals reviewed.  Constitutional:      Appearance: She is well-developed.  Cardiovascular:     Rate and Rhythm: Normal rate and regular rhythm.     Pulses:          Radial pulses are 2+ on the right side and 2+ on the left side.  Pulmonary:     Effort: Pulmonary effort is normal.     Breath sounds: Normal breath sounds and air entry. No decreased breath sounds, wheezing, rhonchi or rales.  Musculoskeletal:     Right lower leg: No edema.     Left lower leg: No edema.  Neurological:     General: No focal deficit present.     Mental Status: She is alert and oriented to person, place, and time.  Psychiatric:        Attention and Perception: Attention and perception normal.        Mood and Affect: Mood and affect normal.      UC Treatments / Results  Labs (all labs ordered are listed, but only abnormal results are displayed) Labs Reviewed - No data to display  EKG   Radiology No results found.  Procedures Procedures (including critical care time)  Medications Ordered in UC Medications - No data to display  Initial Impression / Assessment and Plan / UC Course  I have reviewed the triage vital signs and the nursing notes.  Pertinent labs & imaging results that were available during my care of the patient were reviewed by me and considered in  my medical decision making (see chart for details).     Patient presenting with intermittent central chest discomfort x1 week, with episodes of left arm pain, palpitations, and nausea.  Cardiac history significant for hypertension. Patient also with risk factors including morbid obesity. BP initially 182/91; 144/89 on repeat.  Essential hypertension typically  well controlled on her hctz qd per pt. Abnormal EKG with ST changes throughout. No prior EKG available for comparison. I am sending this [facility] to [facility] for further evaluation, and cardiac work-up. Discussed treatment plan with attending physician Dr. Lanny Cramp who is in agreement with this plan. We discussed option of EMS for transport; patient declines this. She is hemodynamically stable to transport herself at this time. Pt understands that if she develops worsening chest pain, dizziness, weakness, headaches, etc. while on the way to the ED- immediately call EMS. Nicholes Mango  verbalizes understanding and agreement.   Spent over 40 minutes obtaining H&P, performing physical, interpreting EKG, discussing results, treatment plan and plan for follow-up with patient. Patient agrees with plan.   This chart was dictated using voice recognition software, Dragon. Despite the best efforts of this provider to proofread and correct errors, errors may still occur which can change documentation meaning.   Final Clinical Impressions(s) / UC Diagnoses   Final diagnoses:  Essential hypertension  Chest pain, unspecified type  Nonspecific abnormal electrocardiogram (ECG) (EKG)     Discharge Instructions     -Had straight to [facility] emergency department for further evaluation of your chest pain. -If you experience worsening of chest pain, pain down left arm, dizziness, loss of consciousness, etc. on the way to the ER please stop immediately and call EMS.    ED Prescriptions    None     PDMP not reviewed this encounter.    Hazel Sams, PA-C 01/19/21 1718

## 2021-01-19 NOTE — ED Notes (Signed)
Patient is being discharged from the Urgent Care and sent to the Emergency Department via POV . Per Marin Roberts, patient is in need of higher level of care due to Chest Pain. Patient is aware and verbalizes understanding of plan of care.  Vitals:   01/19/21 1630 01/19/21 1649  BP: (!) 182/91 (!) 144/89  Pulse:    Resp:    Temp:    SpO2:

## 2021-01-19 NOTE — ED Triage Notes (Signed)
Pt sent here from [facility] for chest pains for the last week. Reports pain is mid chest tightness, occ radiates into her arm. Pain seems to be related to stress, wakes up in the middle night with palpitations. Has not been sleeping good at night and recent headaches, hx of anxiety.

## 2021-01-19 NOTE — ED Triage Notes (Signed)
Pt presents with intermittent central chest discomfort since last week.

## 2021-01-19 NOTE — Progress Notes (Signed)
MRN : [id number]  Whitney Gray is a 50 y.o. (Feb 13, 1971) female who presents with chief complaint of  Chief Complaint  Patient presents with  . Follow-up    4 week post laser  .  History of Present Illness:   The patient returns to the office for followup status post laser ablation of the left great saphenous vein on 12/18/2020 and the right great saphenous vein on 11/20/2020. The patient notes multiple residual varicosities bilaterally which continued to hurt with dependent positions and remained tender to palpation. The patient's swelling is unchanged from preoperative status. The patient continues to wear graduated compression stockings on a daily basis but these are not eliminating the pain and discomfort. The patient continues to use over-the-counter anti-inflammatory medications to treat the pain and related symptoms but this has not given the patient relief. The patient notes the pain in the lower extremities is causing problems with daily exercise, problems at work and even with household activities such as preparing meals and doing dishes.  The patient is otherwise done well and there have been no complications related to the laser procedure or interval changes in the patient's overall   Venous ultrasound post laser shows successful laser ablation of the of the great saphenous veins bilaterally, no DVT identified. Current Meds  Medication Sig  . ALBUTEROL IN Inhale into the lungs as needed.  . hydrochlorothiazide (MICROZIDE) 12.5 MG capsule Take 1 capsule (12.5 mg total) by mouth daily as needed.    Past Medical History:  Diagnosis Date  . Amenorrhea   . Arthritis    Bilateral knees  . Asthma   . Depression with anxiety 03/25/2020  . Heart murmur   . Pituitary adenoma ([place])   . Prolactinoma [facility])     Past Surgical History:  Procedure Laterality Date  . CESAREAN SECTION  2004  . TUBAL LIGATION      Social History Social History   Tobacco Use  . Smoking status:  Never Smoker  . Smokeless tobacco: Never Used  Vaping Use  . Vaping Use: Never used  Substance Use Topics  . Alcohol use: No    Alcohol/week: 0.0 standard drinks  . Drug use: No    Family History Family History  Problem Relation Age of Onset  . Asthma Mother   . Alcohol abuse Father   . Arthritis Father   . Heart disease Father   . Prostate cancer Maternal Grandfather   . Diabetes Maternal Grandfather     Allergies  Allergen Reactions  . Latex Rash  . Codeine Nausea Only    Per pt,nausea,vomiting,dizzy.      REVIEW OF SYSTEMS (Negative unless checked)  Constitutional: [] Weight loss  [] Fever  [] Chills Cardiac: [] Chest pain   [] Chest pressure   [] Palpitations   [] Shortness of breath when laying flat   [] Shortness of breath with exertion. Vascular:  [] Pain in legs with walking   [x] Pain in legs at rest  [] History of DVT   [] Phlebitis   [x] Swelling in legs   [] Varicose veins   [] Non-healing ulcers Pulmonary:   [] Uses home oxygen   [] Productive cough   [] Hemoptysis   [] Wheeze  [] COPD   [] Asthma Neurologic:  [] Dizziness   [] Seizures   [] History of stroke   [] History of TIA  [] Aphasia   [] Vissual changes   [] Weakness or numbness in arm   [] Weakness or numbness in leg Musculoskeletal:   [] Joint swelling   [] Joint pain   [] Low back pain Hematologic:  [] Easy bruising  []   Easy bleeding   [] Hypercoagulable state   [] Anemic Gastrointestinal:  [] Diarrhea   [] Vomiting  [] Gastroesophageal reflux/heartburn   [] Difficulty swallowing. Genitourinary:  [] Chronic kidney disease   [] Difficult urination  [] Frequent urination   [] Blood in urine Skin:  [] Rashes   [] Ulcers  Psychological:  [] History of anxiety   []  History of major depression.  Physical Examination  Vitals:   01/19/21 1340  BP: (!) 152/86  Pulse: 80  Resp: 16  Weight: 225 lb 12.8 oz (102.4 kg)   Body mass index is 40 kg/m. Gen: WD/WN, NAD Head: [place]/AT, No temporalis wasting.  Ear/Nose/Throat: Hearing grossly intact, nares  w/o erythema or drainage Eyes: PER, EOMI, sclera nonicteric.  Neck: Supple, no large masses.   Pulmonary:  Good air movement, no audible wheezing bilaterally, no use of accessory muscles.  Cardiac: RRR, no JVD Vascular: Large varicosities present extensively greater than 10 mm bilaterally.  Moderate venous stasis changes to the legs bilaterally.  1-2+ soft pitting edema Vessel Right Left  Radial Palpable Palpable  PT Palpable Palpable  DP Palpable Palpable  Gastrointestinal: Non-distended. No guarding/no peritoneal signs.  Musculoskeletal: M/S 5/5 throughout.  No deformity or atrophy.  Neurologic: CN 2-12 intact. Symmetrical.  Speech is fluent. Motor exam as listed above. Psychiatric: Judgment intact, Mood & affect appropriate for pt's clinical situation. Dermatologic: Mild venous rashes no ulcers noted.  No changes consistent with cellulitis.   CBC Lab Results  Component Value Date   WBC 5.5 03/25/2020   HGB 12.1 03/25/2020   HCT 37.2 03/25/2020   MCV 83 03/25/2020   PLT 210.0 09/20/2017    BMET    Component Value Date/Time   NA 141 04/08/2020 1414   K 3.9 04/08/2020 1414   CL 101 04/08/2020 1414   CO2 25 04/08/2020 1414   GLUCOSE 87 04/08/2020 1414   GLUCOSE 90 09/20/2017 1508   BUN 13 04/08/2020 1414   CREATININE 0.99 04/08/2020 1414   CALCIUM 9.5 04/08/2020 1414   GFRNONAA 67 04/08/2020 1414   GFRAA 77 04/08/2020 1414   CrCl cannot be calculated (Patient's most recent lab result is older than the maximum 21 days allowed.).  COAG No results found for: INR, PROTIME  Radiology No results found.   Assessment/Plan 1. Varicose veins of both lower extremities with pain Recommend:  The patient has had successful ablation of the previously incompetent saphenous venous system but still has persistent symptoms of pain and swelling that are having a negative impact on daily life and daily activities.  Patient should undergo injection sclerotherapy to treat the  residual varicosities.  The risks, benefits and alternative therapies were reviewed in detail with the patient.  All questions were answered.  The patient agrees to proceed with sclerotherapy at their convenience.  The patient will continue wearing the graduated compression stockings and using the over-the-counter pain medications to treat her symptoms.     2. Primary hypertension Continue antihypertensive medications as already ordered, these medications have been reviewed and there are no changes at this time.     Hortencia Pilar, MD  01/19/2021 1:41 PM

## 2021-01-19 NOTE — Discharge Instructions (Addendum)
-  Had straight to [facility] emergency department for further evaluation of your chest pain. -If you experience worsening of chest pain, pain down left arm, dizziness, loss of consciousness, etc. on the way to the ER please stop immediately and call EMS.

## 2021-01-20 NOTE — ED Notes (Signed)
Patient states she has to work and cant wait any longer

## 2021-01-25 ENCOUNTER — Encounter (INDEPENDENT_AMBULATORY_CARE_PROVIDER_SITE_OTHER): Payer: Self-pay | Admitting: Vascular Surgery

## 2021-02-04 ENCOUNTER — Ambulatory Visit (INDEPENDENT_AMBULATORY_CARE_PROVIDER_SITE_OTHER): Payer: BC Managed Care – PPO | Admitting: Vascular Surgery

## 2021-02-11 ENCOUNTER — Ambulatory Visit (INDEPENDENT_AMBULATORY_CARE_PROVIDER_SITE_OTHER): Payer: BC Managed Care – PPO | Admitting: Vascular Surgery

## 2021-02-25 ENCOUNTER — Encounter (INDEPENDENT_AMBULATORY_CARE_PROVIDER_SITE_OTHER): Payer: Self-pay | Admitting: Vascular Surgery

## 2021-02-25 ENCOUNTER — Other Ambulatory Visit: Payer: Self-pay

## 2021-02-25 ENCOUNTER — Ambulatory Visit (INDEPENDENT_AMBULATORY_CARE_PROVIDER_SITE_OTHER): Payer: BC Managed Care – PPO | Admitting: Vascular Surgery

## 2021-02-25 VITALS — BP 161/85 | HR 76 | Resp 16 | Wt 228.6 lb

## 2021-02-25 DIAGNOSIS — I83813 Varicose veins of bilateral lower extremities with pain: Secondary | ICD-10-CM | POA: Diagnosis not present

## 2021-02-25 NOTE — Progress Notes (Signed)
Varicose veins of bilateral  lower extremity with inflammation (454.1  I83.10) Current Plans   Indication: Patient presents with symptomatic varicose veins of the bilateral  lower extremity.   Procedure: Sclerotherapy using hypertonic saline mixed with 1% Lidocaine was performed on the bilateral lower extremity. Compression wraps were placed. The patient tolerated the procedure well. 

## 2021-03-04 ENCOUNTER — Ambulatory Visit (INDEPENDENT_AMBULATORY_CARE_PROVIDER_SITE_OTHER): Payer: BC Managed Care – PPO | Admitting: Vascular Surgery

## 2021-04-01 ENCOUNTER — Other Ambulatory Visit: Payer: Self-pay

## 2021-04-01 ENCOUNTER — Encounter (INDEPENDENT_AMBULATORY_CARE_PROVIDER_SITE_OTHER): Payer: Self-pay | Admitting: Vascular Surgery

## 2021-04-01 ENCOUNTER — Ambulatory Visit (INDEPENDENT_AMBULATORY_CARE_PROVIDER_SITE_OTHER): Payer: BC Managed Care – PPO | Admitting: Vascular Surgery

## 2021-04-01 VITALS — BP 159/85 | HR 63 | Resp 16 | Wt 227.6 lb

## 2021-04-01 DIAGNOSIS — I83813 Varicose veins of bilateral lower extremities with pain: Secondary | ICD-10-CM | POA: Diagnosis not present

## 2021-04-01 NOTE — Progress Notes (Signed)
Varicose veins of bilateral  lower extremity with inflammation (454.1  I83.10) Current Plans   Indication: Patient presents with symptomatic varicose veins of the bilateral  lower extremity.   Procedure: Sclerotherapy using hypertonic saline mixed with 1% Lidocaine was performed on the bilateral lower extremity. Compression wraps were placed. The patient tolerated the procedure well. 

## 2021-04-14 ENCOUNTER — Telehealth: Payer: Self-pay

## 2021-04-14 ENCOUNTER — Ambulatory Visit: Payer: Self-pay | Admitting: Internal Medicine

## 2021-04-14 NOTE — Telephone Encounter (Signed)
Unfortunately unable to take patients at this time.   It looks like she was seen at [facility] and they have a new provider who should be able to see her.

## 2021-04-14 NOTE — Telephone Encounter (Signed)
Pt called to see if you would take her as a new pt, she had an est care appt last mth that the office canceled per the provider leaving the practice and she is finding it hard to find another office that is taking new patients...  She has been having possible gall bladder issues and would like to have PCP and be evaluated   You currently see her husband [phone] Whitney Gray  Please advise

## 2021-04-15 NOTE — Telephone Encounter (Signed)
This patient was called and informed that Dr. Einar Pheasant was not taking on new patients and that [facility] had a new provider. Patient stated that she would call back over there due to her having gall bladder pain.

## 2021-04-29 ENCOUNTER — Ambulatory Visit (INDEPENDENT_AMBULATORY_CARE_PROVIDER_SITE_OTHER): Payer: BC Managed Care – PPO | Admitting: Vascular Surgery

## 2021-04-29 ENCOUNTER — Other Ambulatory Visit: Payer: Self-pay

## 2021-04-29 ENCOUNTER — Encounter (INDEPENDENT_AMBULATORY_CARE_PROVIDER_SITE_OTHER): Payer: Self-pay | Admitting: Vascular Surgery

## 2021-04-29 VITALS — BP 141/80 | HR 66 | Ht 63.0 in | Wt 227.0 lb

## 2021-04-29 DIAGNOSIS — I83813 Varicose veins of bilateral lower extremities with pain: Secondary | ICD-10-CM | POA: Diagnosis not present

## 2021-04-29 NOTE — Progress Notes (Signed)
Varicose veins of bilateral  lower extremity with inflammation (454.1  I83.10) Current Plans   Indication: Patient presents with symptomatic varicose veins of the bilateral  lower extremity.   Procedure: Sclerotherapy using hypertonic saline mixed with 1% Lidocaine was performed on the bilateral lower extremity. Compression wraps were placed. The patient tolerated the procedure well. 

## 2021-05-27 ENCOUNTER — Ambulatory Visit (INDEPENDENT_AMBULATORY_CARE_PROVIDER_SITE_OTHER): Payer: BC Managed Care – PPO | Admitting: Vascular Surgery

## 2021-06-09 ENCOUNTER — Encounter: Payer: Self-pay | Admitting: Adult Health

## 2021-06-10 ENCOUNTER — Ambulatory Visit: Payer: Self-pay | Admitting: Internal Medicine

## 2021-06-24 ENCOUNTER — Other Ambulatory Visit: Payer: Self-pay

## 2021-06-24 ENCOUNTER — Ambulatory Visit (INDEPENDENT_AMBULATORY_CARE_PROVIDER_SITE_OTHER): Payer: BC Managed Care – PPO | Admitting: Vascular Surgery

## 2021-06-24 ENCOUNTER — Encounter (INDEPENDENT_AMBULATORY_CARE_PROVIDER_SITE_OTHER): Payer: Self-pay | Admitting: Vascular Surgery

## 2021-06-24 VITALS — BP 157/88 | HR 65 | Ht 63.0 in | Wt 225.0 lb

## 2021-06-24 DIAGNOSIS — I83813 Varicose veins of bilateral lower extremities with pain: Secondary | ICD-10-CM

## 2021-06-24 NOTE — Progress Notes (Signed)
Varicose veins of bilateral  lower extremity with inflammation (454.1  I83.10) Current Plans   Indication: Patient presents with symptomatic varicose veins of the bilateral  lower extremity.   Procedure: Sclerotherapy using hypertonic saline mixed with 1% Lidocaine was performed on the bilateral lower extremity. Compression wraps were placed. The patient tolerated the procedure well. 

## 2021-07-15 ENCOUNTER — Encounter: Payer: BC Managed Care – PPO | Admitting: Adult Health

## 2021-07-15 ENCOUNTER — Ambulatory Visit (HOSPITAL_BASED_OUTPATIENT_CLINIC_OR_DEPARTMENT_OTHER)
Admission: RE | Admit: 2021-07-15 | Discharge: 2021-07-15 | Disposition: A | Discharge status: Home / Self Care | Payer: BC Managed Care – PPO | Source: Ambulatory Visit | Attending: Medical | Admitting: Medical

## 2021-07-15 ENCOUNTER — Other Ambulatory Visit: Payer: Self-pay

## 2021-07-15 ENCOUNTER — Encounter: Payer: Self-pay | Admitting: Medical

## 2021-07-15 ENCOUNTER — Telehealth (INDEPENDENT_AMBULATORY_CARE_PROVIDER_SITE_OTHER): Payer: BC Managed Care – PPO | Admitting: Medical

## 2021-07-15 ENCOUNTER — Other Ambulatory Visit: Payer: Self-pay | Admitting: Medical

## 2021-07-15 VITALS — BP 137/84 | HR 69 | Temp 99.0°F | Resp 12 | Ht 63.0 in | Wt 223.2 lb

## 2021-07-15 DIAGNOSIS — R109 Unspecified abdominal pain: Secondary | ICD-10-CM

## 2021-07-15 DIAGNOSIS — Z1211 Encounter for screening for malignant neoplasm of colon: Secondary | ICD-10-CM

## 2021-07-15 DIAGNOSIS — K8020 Calculus of gallbladder without cholecystitis without obstruction: Secondary | ICD-10-CM

## 2021-07-15 DIAGNOSIS — R5383 Other fatigue: Secondary | ICD-10-CM

## 2021-07-15 LAB — COMPREHENSIVE METABOLIC PANEL
ALT: 13 U/L (ref 0–35)
AST: 15 U/L (ref 0–37)
Albumin: 3.9 g/dL (ref 3.5–5.2)
Alkaline Phosphatase: 75 U/L (ref 39–117)
BUN: 10 mg/dL (ref 6–23)
CO2: 30 mEq/L (ref 19–32)
Calcium: 8.9 mg/dL (ref 8.4–10.5)
Chloride: 103 mEq/L (ref 96–112)
Creatinine, Ser: 0.92 mg/dL (ref 0.40–1.20)
GFR: 71.83 mL/min (ref >60.00)
Glucose, Bld: 97 mg/dL (ref 70–99)
Potassium: 4.1 mEq/L (ref 3.5–5.1)
Sodium: 139 mEq/L (ref 135–145)
Total Bilirubin: 0.4 mg/dL (ref 0.2–1.2)
Total Protein: 7.2 g/dL (ref 6.0–8.3)

## 2021-07-15 LAB — CBC WITH DIFFERENTIAL/PLATELET
Basophils Absolute: 0 10*3/uL (ref 0.0–0.1)
Basophils Relative: 0.5 % (ref 0.0–3.0)
Eosinophils Absolute: 0.1 10*3/uL (ref 0.0–0.7)
Eosinophils Relative: 2.1 % (ref 0.0–5.0)
HCT: 33.9 % — ABNORMAL LOW (ref 36.0–46.0)
Hemoglobin: 11 g/dL — ABNORMAL LOW (ref 12.0–15.0)
Lymphocytes Relative: 19.3 % (ref 12.0–46.0)
Lymphs Abs: 1.2 10*3/uL (ref 0.7–4.0)
MCHC: 32.4 g/dL (ref 30.0–36.0)
MCV: 83 fl (ref 78.0–100.0)
Monocytes Absolute: 0.4 10*3/uL (ref 0.1–1.0)
Monocytes Relative: 6 % (ref 3.0–12.0)
Neutro Abs: 4.5 10*3/uL (ref 1.4–7.7)
Neutrophils Relative %: 72.1 % (ref 43.0–77.0)
Platelets: 233 10*3/uL (ref 150.0–400.0)
RBC: 4.08 Mil/uL (ref 3.87–5.11)
RDW: 15.1 % (ref 11.5–15.5)
WBC: 6.2 10*3/uL (ref 4.0–10.5)

## 2021-07-15 LAB — LIPASE: Lipase: 32 U/L (ref 11.0–59.0)

## 2021-07-15 LAB — IRON: Iron: 72 ug/dL (ref 42–145)

## 2021-07-15 LAB — T4, FREE: Free T4: 0.9 ng/dL (ref 0.60–1.60)

## 2021-07-15 LAB — TSH: TSH: 0.61 u[IU]/mL (ref 0.35–5.50)

## 2021-07-15 LAB — VITAMIN B12: Vitamin B-12: 235 pg/mL (ref 211–911)

## 2021-07-15 MED ORDER — FAMOTIDINE 20 MG PO TABS: 20.0000 mg | ORAL_TABLET | Freq: Every day | ORAL | 3 refills | Status: DC

## 2021-07-15 NOTE — Addendum Note (Signed)
Addended by: Anabel Halon on: 07/15/2021 09:30 PM   Modules accepted: Orders

## 2021-07-15 NOTE — Patient Instructions (Addendum)
History of chronic intermittent moderate to severe abdomen pain over the past 8 months.  More recently seems to be epigastric and right upper quadrant region.  We will get stat CBC, CMP and a lipase.   Some fatigue reported so included iron, thyroid studies and a B12 level.  Placed order for stat abdomen ultrasound.  Hopefully he will be able to get study done later today or tomorrow morning(at the latest).  Ordered I fob test. Please turn in that test tomorrow or Friday.  Patient breath test today.  After ultrasound recommend and very bland strict diet.  Also prescribed some famotadine.  Follow-up date to be determined after lab and imaging review.  Placed GI referral for screening colonoscopy.

## 2021-07-15 NOTE — Addendum Note (Signed)
Addended by: Manuela Schwartz on: 07/15/2021 02:18 PM   Modules accepted: Orders

## 2021-07-15 NOTE — Progress Notes (Signed)
Subjective:    Patient ID: Whitney Gray, female    DOB: 1971/11/23, 50 y.o.   MRN: [id number]  HPI  History of Present Illness:  First time with me.  On review hx of anxiety, allergies and htn.  This Thursday got upset stomach. Tender in her abdomen. She noted when she did not eat her stomach felt better. Started to eat lightly and hydrating. Stated recently noted meat seemed to bother her stomach.  Pt notes that since January note improvement in pain if she avoid fried foods.   Does not report acid sensation/reflux type symptoms.  Pt does not drink alcohol at all.  When has pain describes diffuse pain. Pt states sometimes pain mid epigastric to rt side. Sometimes in past will get nausea and vomit with pain.  Pt states past Thursday had dark looking stool. This was before she used peptobismal. Another event of dark stool but had taken pepto.   Last bowel movement Monday evening and normal in the past intemrittent constipation.  Last 24 hours pain eased up. Now level 1-2/10.   5 or more year ago she states in office US done and told had gallstones.   Pt give somewhat complicated hx of stating gi symptoms since beginning of this year.  She states various attempts to seen by her pcp but those appointments always fell thru.  I explained to pt she needs to be seen in person. She is not having covid symptoms. We called pt and gave her address. She stated could get her by 12 noon. I explained with nature of complaint video visit not adequate. Explained abdomen exam important. Labs and imaging probable based on chronicity.    Observations/Objective:  General Mental Status- Alert. General Appearance- Not in acute distress.   Skin General: Color- Normal Color. Moisture- Normal Moisture.  Neck Carotid Arteries- Normal color. Moisture- Normal Moisture. No carotid bruits. No JVD.  Chest and Lung Exam Auscultation: Breath Sounds:-Normal.  Cardiovascular Auscultation:Rythm-  Regular. Murmurs & Other Heart Sounds:Auscultation of the heart reveals- No Murmurs.  Abdomen Inspection:-Inspeection Normal. Palpation/Percussion:Note:No mass. Palpation and Percussion of the abdomen reveal- Non Tender, Non Distended + BS, no rebound or guarding.   Neurologic Cranial Nerve exam:- CN III-XII intact(No nystagmus), symmetric smile. Strength:- 5/5 equal and symmetric strength both upper and lower extremities.   Back- no cva tenderness.  Assessment and Plan:   Follow Up Instructions:    I discussed the assessment and treatment plan with the patient. The patient was provided an opportunity to ask questions and all were answered. The patient agreed with the plan and demonstrated an understanding of the instructions.   The patient was advised to call back or seek an in-person evaluation if the symptoms worsen or if the condition fails to improve as anticipated.     Mackie Pai, PA-C   Review of Systems  Constitutional:  Negative for chills, fatigue and fever.  Respiratory:  Negative for cough, chest tightness, shortness of breath and wheezing.   Cardiovascular:  Negative for chest pain and palpitations.  Gastrointestinal:  Positive for abdominal pain. Negative for abdominal distention, constipation, nausea and rectal pain.  Genitourinary:  Negative for difficulty urinating and dysuria.  Musculoskeletal:  Negative for back pain, gait problem and myalgias.  Skin:  Negative for rash.  Neurological:  Negative for dizziness, weakness, light-headedness, numbness and headaches.  Hematological:  Negative for adenopathy. Does not bruise/bleed easily.  Psychiatric/Behavioral:  Negative for behavioral problems. The patient is not nervous/anxious and is not hyperactive.  Past Medical History:  Diagnosis Date   Amenorrhea    Arthritis    Bilateral knees   Asthma    Depression with anxiety 03/25/2020   Heart murmur    Pituitary adenoma ([facility])    Prolactinoma ([place])       Social History   Socioeconomic History   Marital status: Married    Spouse name: Not on file   Number of children: 2   Years of education: 18   Highest education level: Not on file  Occupational History   Occupation: Med Tech  Tobacco Use   Smoking status: Never   Smokeless tobacco: Never  Vaping Use   Vaping Use: Never used  Substance and Sexual Activity   Alcohol use: No    Alcohol/week: 0.0 standard drinks   Drug use: No   Sexual activity: Not Currently    Partners: Male    Birth control/protection: None  Other Topics Concern   Not on file  Social History Narrative   Fun: Play games   Denies abuse and feels safe at home.   Denies religious beliefs effecting health care.    Social Determinants of Health   Financial Resource Strain: Not on file  Food Insecurity: Not on file  Transportation Needs: Not on file  Physical Activity: Not on file  Stress: Not on file  Social Connections: Not on file  Intimate Partner Violence: Not on file    Past Surgical History:  Procedure Laterality Date   CESAREAN SECTION  2004   TUBAL LIGATION      Family History  Problem Relation Age of Onset   Asthma Mother    Alcohol abuse Father    Arthritis Father    Heart disease Father    Prostate cancer Maternal Grandfather    Diabetes Maternal Grandfather     Allergies  Allergen Reactions   Latex Rash   Codeine Nausea Only    Per pt,nausea,vomiting,dizzy.     Current Outpatient Medications on File Prior to Visit  Medication Sig Dispense Refill   ALBUTEROL IN Inhale into the lungs as needed.     ALPRAZolam (XANAX) 0.5 MG tablet Take 0.5 mg by mouth 2 (two) times daily as needed.     Azelastine-Fluticasone (DYMISTA) 137-50 MCG/ACT SUSP Place 1 spray into both nostrils in the morning and at bedtime. 23 g 5   fluticasone (FLONASE) 50 MCG/ACT nasal spray Place 2 sprays into both nostrils daily. 9.9 mL 3   hydrochlorothiazide (MICROZIDE) 12.5 MG capsule Take 1 capsule  (12.5 mg total) by mouth daily as needed. 30 capsule 1   ondansetron (ZOFRAN-ODT) 4 MG disintegrating tablet Take 1 tablet (4 mg total) by mouth every 8 (eight) hours as needed for nausea or vomiting. 15 tablet 0   No current facility-administered medications on file prior to visit.    LMP  (LMP Unknown)        Objective:   Physical Exam  General Mental Status- Alert. General Appearance- Not in acute distress.   Skin General: Color- Normal Color. Moisture- Normal Moisture.  Neck Carotid Arteries- Normal color. Moisture- Normal Moisture. No carotid bruits. No JVD.  Chest and Lung Exam Auscultation: Breath Sounds:-Normal.  Cardiovascular Auscultation:Rythm- Regular. Murmurs & Other Heart Sounds:Auscultation of the heart reveals- No Murmurs.  Abdomen Inspection:-Inspeection Normal. Palpation/Percussion:Note:No mass. Palpation and Percussion of the abdomen reveal- Non Tender, Non Distended + BS, no rebound or guarding.  Neurologic Cranial Nerve exam:- CN III-XII intact(No nystagmus), symmetric smile. Strength:- 5/5 equal and symmetric strength  both upper and lower extremities.       Assessment & Plan:   History of chronic intermittent moderate to severe abdomen pain over the past 8 months.  More recently seems to be epigastric and right upper quadrant region.  We will get stat CBC, CMP and a lipase.   Some fatigue reported so included iron, thyroid studies and a B12 level.  Placed order for stat abdomen ultrasound.  Hopefully he will be able to get study done later today or tomorrow morning(at the latest).  Ordered I fob test. Please turn in that test tomorrow or Friday.  Patient breath test today.  After ultrasound recommend and very bland strict diet.  Also prescribed some famotadine.  Follow-up date to be determined after lab and imaging review.  Placed GI referral for screening colonoscopy.  Mackie Pai, PA-C   Time spent with patient today was  45  minutes which consisted of chart reviiew, discussing diagnosis, work up treatment and documentation.   Patient is new patient from other [place] office.  Total time includes initial video visit conversation but then change to in person due to first time with me and nature of complaint.

## 2021-07-15 NOTE — Addendum Note (Signed)
Addended by: Manuela Schwartz on: 07/15/2021 01:34 PM   Modules accepted: Orders

## 2021-07-15 NOTE — Addendum Note (Signed)
Addended by: Manuela Schwartz on: 07/15/2021 02:54 PM   Modules accepted: Orders

## 2021-07-15 NOTE — Addendum Note (Signed)
Addended by: Manuela Schwartz on: 07/15/2021 03:01 PM   Modules accepted: Orders

## 2021-07-16 ENCOUNTER — Telehealth: Payer: Self-pay

## 2021-07-16 NOTE — Telephone Encounter (Signed)
Pt called in states she had some questions and would like a call

## 2021-07-16 NOTE — Telephone Encounter (Signed)
Pt called

## 2021-07-17 LAB — H.PYLORI BREATH TEST (REFLEX): H. pylori Breath Test: NEGATIVE

## 2021-07-17 LAB — H. PYLORI BREATH TEST

## 2021-07-22 ENCOUNTER — Other Ambulatory Visit: Payer: Self-pay

## 2021-07-22 ENCOUNTER — Encounter (INDEPENDENT_AMBULATORY_CARE_PROVIDER_SITE_OTHER): Payer: Self-pay | Admitting: Vascular Surgery

## 2021-07-22 ENCOUNTER — Ambulatory Visit (INDEPENDENT_AMBULATORY_CARE_PROVIDER_SITE_OTHER): Payer: BC Managed Care – PPO | Admitting: Vascular Surgery

## 2021-07-22 VITALS — BP 165/83 | HR 62 | Resp 16 | Wt 222.0 lb

## 2021-07-22 DIAGNOSIS — I83813 Varicose veins of bilateral lower extremities with pain: Secondary | ICD-10-CM

## 2021-07-22 NOTE — Progress Notes (Signed)
Varicose veins of bilateral  lower extremity with inflammation (454.1  I83.10) Current Plans   Indication: Patient presents with symptomatic varicose veins of the bilateral  lower extremity.   Procedure: Sclerotherapy using hypertonic saline mixed with 1% Lidocaine was performed on the bilateral lower extremity. Compression wraps were placed. The patient tolerated the procedure well. 

## 2021-07-24 ENCOUNTER — Telehealth: Payer: Self-pay

## 2021-07-24 ENCOUNTER — Ambulatory Visit (INDEPENDENT_AMBULATORY_CARE_PROVIDER_SITE_OTHER): Payer: BC Managed Care – PPO | Admitting: Medical

## 2021-07-24 ENCOUNTER — Other Ambulatory Visit: Payer: Self-pay

## 2021-07-24 VITALS — BP 170/88 | HR 67 | Resp 18 | Ht 63.0 in | Wt 222.2 lb

## 2021-07-24 DIAGNOSIS — Z124 Encounter for screening for malignant neoplasm of cervix: Secondary | ICD-10-CM | POA: Diagnosis not present

## 2021-07-24 DIAGNOSIS — E538 Deficiency of other specified B group vitamins: Secondary | ICD-10-CM

## 2021-07-24 DIAGNOSIS — Z0000 Encounter for general adult medical examination without abnormal findings: Secondary | ICD-10-CM

## 2021-07-24 DIAGNOSIS — Z23 Encounter for immunization: Secondary | ICD-10-CM

## 2021-07-24 DIAGNOSIS — Z1211 Encounter for screening for malignant neoplasm of colon: Secondary | ICD-10-CM

## 2021-07-24 DIAGNOSIS — Z1231 Encounter for screening mammogram for malignant neoplasm of breast: Secondary | ICD-10-CM | POA: Diagnosis not present

## 2021-07-24 MED ORDER — LOSARTAN POTASSIUM-HCTZ 50-12.5 MG PO TABS: 1.0000 | ORAL_TABLET | Freq: Every day | ORAL | 3 refills | Status: DC

## 2021-07-24 MED ORDER — CYANOCOBALAMIN 1000 MCG/ML IJ SOLN
1000.0000 ug | Freq: Once | INTRAMUSCULAR | Status: AC
  Administered 2021-07-24: 1000 ug via INTRAMUSCULAR

## 2021-07-24 MED ORDER — CYANOCOBALAMIN 1000 MCG/ML IJ SOLN: INTRAMUSCULAR | 0 refills | Status: DC

## 2021-07-24 MED ORDER — "EASY TOUCH SAFETY SYRINGE 22G X 1"" 3 ML MISC": 0 refills | Status: DC

## 2021-07-24 NOTE — Addendum Note (Signed)
Addended by: Kelle Darting A on: 07/24/2021 02:37 PM   Modules accepted: Orders

## 2021-07-24 NOTE — Patient Instructions (Addendum)
For you wellness exam today I have ordered cbc, cmp and  lipid panel.  Ask your insurance about shingles vaccine coverage. If not covered and you want to pay for that is option.(Mom case severe)  Recommend exercise and healthy diet.  We will let you know lab results as they come in.   For b12 staff making arrangements for you to do b12 injections at home.  Placed referral for pap, mammogram and screening colonoscopy.  Your bp is high today. With level like this need to give bp medication losartan hctz. Check bp daily. If bp increasing be seen next week. If cardiac or neurologic signs/symptoms be seen in the ED.  Follow up in 10 days or sooner if needed.   Preventive Care 29-41 Years Old, Female Preventive care refers to lifestyle choices and visits with your health care provider that can promote health and wellness. This includes: A yearly physical exam. This is also called an annual wellness visit. Regular dental and eye exams. Immunizations. Screening for certain conditions. Healthy lifestyle choices, such as: Eating a healthy diet. Getting regular exercise. Not using drugs or products that contain nicotine and tobacco. Limiting alcohol use. What can I expect for my preventive care visit? Physical exam Your health care provider will check your: Height and weight. These may be used to calculate your BMI (body mass index). BMI is a measurement that tells if you are at a healthy weight. Heart rate and blood pressure. Body temperature. Skin for abnormal spots. Counseling Your health care provider may ask you questions about your: Past medical problems. Family's medical history. Alcohol, tobacco, and drug use. Emotional well-being. Home life and relationship well-being. Sexual activity. Diet, exercise, and sleep habits. Work and work Statistician. Access to firearms. Method of birth control. Menstrual cycle. Pregnancy history. What immunizations do I need?  Vaccines  are usually given at various ages, according to a schedule. Your health care provider will recommend vaccines for you based on your age, medicalhistory, and lifestyle or other factors, such as travel or where you work. What tests do I need? Blood tests Lipid and cholesterol levels. These may be checked every 5 years, or more often if you are over 64 years old. Hepatitis C test. Hepatitis B test. Screening Lung cancer screening. You may have this screening every year starting at age 50 if you have a 30-pack-year history of smoking and currently smoke or have quit within the past 15 years. Colorectal cancer screening. All adults should have this screening starting at age 81 and continuing until age 49. Your health care provider may recommend screening at age 23 if you are at increased risk. You will have tests every 1-10 years, depending on your results and the type of screening test. Diabetes screening. This is done by checking your blood sugar (glucose) after you have not eaten for a while (fasting). You may have this done every 1-3 years. Mammogram. This may be done every 1-2 years. Talk with your health care provider about when you should start having regular mammograms. This may depend on whether you have a family history of breast cancer. BRCA-related cancer screening. This may be done if you have a family history of breast, ovarian, tubal, or peritoneal cancers. Pelvic exam and Pap test. This may be done every 3 years starting at age 56. Starting at age 20, this may be done every 5 years if you have a Pap test in combination with an HPV test. Other tests STD (sexually transmitted disease) testing,  if you are at risk. Bone density scan. This is done to screen for osteoporosis. You may have this scan if you are at high risk for osteoporosis. Talk with your health care provider about your test results, treatment options,and if necessary, the need for more tests. Follow these instructions  at home: Eating and drinking  Eat a diet that includes fresh fruits and vegetables, whole grains, lean protein, and low-fat dairy products. Take vitamin and mineral supplements as recommended by your health care provider. Do not drink alcohol if: Your health care provider tells you not to drink. You are pregnant, may be pregnant, or are planning to become pregnant. If you drink alcohol: Limit how much you have to 0-1 drink a day. Be aware of how much alcohol is in your drink. In the [place] one drink equals one 12 oz bottle of beer (355 mL), one 5 oz glass of wine (148 mL), or one 1 oz glass of hard liquor (44 mL).  Lifestyle Take daily care of your teeth and gums. Brush your teeth every morning and night with fluoride toothpaste. Floss one time each day. Stay active. Exercise for at least 30 minutes 5 or more days each week. Do not use any products that contain nicotine or tobacco, such as cigarettes, e-cigarettes, and chewing tobacco. If you need help quitting, ask your health care provider. Do not use drugs. If you are sexually active, practice safe sex. Use a condom or other form of protection to prevent STIs (sexually transmitted infections). If you do not wish to become pregnant, use a form of birth control. If you plan to become pregnant, see your health care provider for a prepregnancy visit. If told by your health care provider, take low-dose aspirin daily starting at age 57. Find healthy ways to cope with stress, such as: Meditation, yoga, or listening to music. Journaling. Talking to a trusted person. Spending time with friends and family. Safety Always wear your seat belt while driving or riding in a vehicle. Do not drive: If you have been drinking alcohol. Do not ride with someone who has been drinking. When you are tired or distracted. While texting. Wear a helmet and other protective equipment during sports activities. If you have firearms in your house, make sure you  follow all gun safety procedures. What's next? Visit your health care provider once a year for an annual wellness visit. Ask your health care provider how often you should have your eyes and teeth checked. Stay up to date on all vaccines. This information is not intended to replace advice given to you by your health care provider. Make sure you discuss any questions you have with your healthcare provider. Document Revised: 09/02/2020 Document Reviewed: 08/10/2018 Elsevier Patient Education  2022 [employer].

## 2021-07-24 NOTE — Progress Notes (Signed)
Subjective:    Patient ID: Whitney Gray, female    DOB: 1971-06-26, 50 y.o.   MRN: [id number]  HPI  Pt work [employer]. Pt does not exercise regularly. Pt states moderate healthy. 2 sodas a week. Drinks some sweet tea. Non smoker. No alcohol use.   Pt does have appointment with surgeon this coming Tuesday. Known gallstone on xray.   Low b12 level. Pt started b12 injections.   No hx of colonoscopy  Pt thinks 5 years since last pap. Also mammogram done 5 years ago per epic.  Pt bp initially very high. Last visit not high? Recheked. Pt after covid infection had high bp. She was given hctz. Has been out of hctz for more than a year.   Review of Systems  Constitutional:  Negative for chills, fatigue and fever.  Respiratory:  Negative for cough, chest tightness, shortness of breath and wheezing.   Cardiovascular:  Negative for chest pain and palpitations.  Gastrointestinal:  Negative for abdominal pain, constipation and nausea.  Genitourinary:  Negative for decreased urine volume, dyspareunia, enuresis, frequency and hematuria.  Musculoskeletal:  Negative for back pain, gait problem, myalgias and neck stiffness.  Skin:  Negative for rash.    Past Medical History:  Diagnosis Date   Amenorrhea    Arthritis    Bilateral knees   Asthma    Depression with anxiety 03/25/2020   Heart murmur    Pituitary adenoma (HCC)    Prolactinoma ([place])      Social History   Socioeconomic History   Marital status: Married    Spouse name: Not on file   Number of children: 2   Years of education: 18   Highest education level: Not on file  Occupational History   Occupation: Med Tech  Tobacco Use   Smoking status: Never   Smokeless tobacco: Never  Vaping Use   Vaping Use: Never used  Substance and Sexual Activity   Alcohol use: No    Alcohol/week: 0.0 standard drinks   Drug use: No   Sexual activity: Not Currently    Partners: Male    Birth control/protection: None  Other Topics  Concern   Not on file  Social History Narrative   Fun: Play games   Denies abuse and feels safe at home.   Denies religious beliefs effecting health care.    Social Determinants of Health   Financial Resource Strain: Not on file  Food Insecurity: Not on file  Transportation Needs: Not on file  Physical Activity: Not on file  Stress: Not on file  Social Connections: Not on file  Intimate Partner Violence: Not on file    Past Surgical History:  Procedure Laterality Date   CESAREAN SECTION  2004   TUBAL LIGATION      Family History  Problem Relation Age of Onset   Asthma Mother    Alcohol abuse Father    Arthritis Father    Heart disease Father    Prostate cancer Maternal Grandfather    Diabetes Maternal Grandfather     Allergies  Allergen Reactions   Latex Rash   Codeine Nausea Only    Per pt,nausea,vomiting,dizzy.     Current Outpatient Medications on File Prior to Visit  Medication Sig Dispense Refill   ALBUTEROL IN Inhale into the lungs as needed.     ALPRAZolam (XANAX) 0.5 MG tablet Take 0.5 mg by mouth 2 (two) times daily as needed.     Azelastine-Fluticasone (DYMISTA) 137-50 MCG/ACT SUSP Place 1 spray  into both nostrils in the morning and at bedtime. 23 g 5   famotidine (PEPCID) 20 MG tablet Take 1 tablet (20 mg total) by mouth daily. 30 tablet 3   fluticasone (FLONASE) 50 MCG/ACT nasal spray Place 2 sprays into both nostrils daily. 9.9 mL 3   hydrochlorothiazide (MICROZIDE) 12.5 MG capsule Take 1 capsule (12.5 mg total) by mouth daily as needed. 30 capsule 1   ondansetron (ZOFRAN-ODT) 4 MG disintegrating tablet Take 1 tablet (4 mg total) by mouth every 8 (eight) hours as needed for nausea or vomiting. 15 tablet 0   No current facility-administered medications on file prior to visit.    BP (!) 180/80   Pulse 67   Resp 18   Ht '5\' 3"'$  (1.6 m)   Wt 222 lb 3.2 oz (100.8 kg)   LMP  (LMP Unknown)   SpO2 98%   BMI 39.36 kg/m        Objective:   Physical  Exam  General Mental Status- Alert. General Appearance- Not in acute distress.   Skin General: Color- Normal Color. Moisture- Normal Moisture.  Neck Carotid Arteries- Normal color. Moisture- Normal Moisture. No carotid bruits. No JVD.  Chest and Lung Exam Auscultation: Breath Sounds:-Normal.  Cardiovascular Auscultation:Rythm- Regular. Murmurs & Other Heart Sounds:Auscultation of the heart reveals- No Murmurs.  Abdomen Inspection:-Inspeection Normal. Palpation/Percussion:Note:No mass. Palpation and Percussion of the abdomen reveal- Non Tender, Non Distended + BS, no rebound or guarding.    Neurologic Cranial Nerve exam:- CN III-XII intact(No nystagmus), symmetric smile. Drift Test:- No drift. Romberg Exam:- Negative.  Heal to Toe Gait exam:-Normal. Finger to Nose:- Normal/Intact Strength:- 5/5 equal and symmetric strength both upper and lower extremities.       Assessment & Plan:   For you wellness exam today I have ordered cbc, cmp and  lipid panel.  Ask your insurance about shingles vaccine coverage. If not covered and you want to pay for that is option.(Mom case severe)  Recommend exercise and healthy diet.  We will let you know lab results as they come in.   For b12 staff making arrangements for you to do b12 injections at home.  Placed referral for pap, mammogram and screening colonoscopy.  Your bp is high today. With level like this need to give bp medication losartan hctz. Check bp daily. If bp increasing be seen next week. If cardiac or neurologic signs/symptoms be seen in the ED.  Follow up in 10 days or sooner if needed.  Mackie Pai, [place]   [id number] as did address htn, b12 and discussed gallstones.

## 2021-07-24 NOTE — Telephone Encounter (Signed)
Patient given 1/4 weekly b12 injections in office today , tried to schedule patient for [place] for other injections and asked if she could inject herself at home , ok'd by PCP . B12 vials and syringes sent to patient's pharmacy , pt is to re-check b12 in 6 months.   Dose: 1/4 and then once monthly for 5 months , order written on 07/15/2021

## 2021-07-24 NOTE — Addendum Note (Signed)
Addended by: Jeronimo Greaves on: 07/24/2021 02:17 PM   Modules accepted: Orders

## 2021-07-24 NOTE — Addendum Note (Signed)
Addended by: Kelle Darting A on: 07/24/2021 02:10 PM   Modules accepted: Orders

## 2021-07-25 LAB — CBC WITH DIFFERENTIAL/PLATELET
Basophils Absolute: 0 10*3/uL (ref 0.0–0.2)
Basos: 1 %
EOS (ABSOLUTE): 0.1 10*3/uL (ref 0.0–0.4)
Eos: 2 %
Hematocrit: 34.8 % (ref 34.0–46.6)
Hemoglobin: 11 g/dL — ABNORMAL LOW (ref 11.1–15.9)
Immature Grans (Abs): 0 10*3/uL (ref 0.0–0.1)
Immature Granulocytes: 0 %
Lymphocytes Absolute: 1.3 10*3/uL (ref 0.7–3.1)
Lymphs: 20 %
MCH: 26.3 pg — ABNORMAL LOW (ref 26.6–33.0)
MCHC: 31.6 g/dL (ref 31.5–35.7)
MCV: 83 fL (ref 79–97)
Monocytes Absolute: 0.3 10*3/uL (ref 0.1–0.9)
Monocytes: 5 %
Neutrophils Absolute: 4.7 10*3/uL (ref 1.4–7.0)
Neutrophils: 72 %
Platelets: 215 10*3/uL (ref 150–450)
RBC: 4.18 x10E6/uL (ref 3.77–5.28)
RDW: 14.5 % (ref 11.7–15.4)
WBC: 6.5 10*3/uL (ref 3.4–10.8)

## 2021-07-25 LAB — LIPID PANEL
Chol/HDL Ratio: 3.3 ratio (ref 0.0–4.4)
Cholesterol, Total: 150 mg/dL (ref 100–199)
HDL: 46 mg/dL (ref >39)
LDL Chol Calc (NIH): 89 mg/dL (ref 0–99)
Triglycerides: 75 mg/dL (ref 0–149)
VLDL Cholesterol Cal: 15 mg/dL (ref 5–40)

## 2021-07-25 LAB — COMPREHENSIVE METABOLIC PANEL
ALT: 8 IU/L (ref 0–32)
AST: 11 IU/L (ref 0–40)
Albumin/Globulin Ratio: 1.6 (ref 1.2–2.2)
Albumin: 4.2 g/dL (ref 3.8–4.9)
Alkaline Phosphatase: 93 IU/L (ref 44–121)
BUN/Creatinine Ratio: 18 (ref 9–23)
BUN: 14 mg/dL (ref 6–24)
Bilirubin Total: 0.6 mg/dL (ref 0.0–1.2)
CO2: 25 mmol/L (ref 20–29)
Calcium: 9.2 mg/dL (ref 8.7–10.2)
Chloride: 103 mmol/L (ref 96–106)
Creatinine, Ser: 0.76 mg/dL (ref 0.57–1.00)
Globulin, Total: 2.6 g/dL (ref 1.5–4.5)
Glucose: 90 mg/dL (ref 65–99)
Potassium: 4 mmol/L (ref 3.5–5.2)
Sodium: 140 mmol/L (ref 134–144)
Total Protein: 6.8 g/dL (ref 6.0–8.5)
eGFR: 94 mL/min/{1.73_m2} (ref >59)

## 2021-07-25 LAB — HEMOGLOBIN A1C
Est. average glucose Bld gHb Est-mCnc: 126 mg/dL
Hgb A1c MFr Bld: 6 % — ABNORMAL HIGH (ref 4.8–5.6)

## 2021-07-28 ENCOUNTER — Ambulatory Visit: Payer: Self-pay | Admitting: Surgery

## 2021-07-28 DIAGNOSIS — K8010 Calculus of gallbladder with chronic cholecystitis without obstruction: Secondary | ICD-10-CM | POA: Diagnosis not present

## 2021-08-04 NOTE — Patient Instructions (Addendum)
DUE TO COVID-19 ONLY ONE VISITOR IS ALLOWED TO COME WITH YOU AND STAY IN THE WAITING ROOM ONLY DURING PRE OP AND PROCEDURE.   **NO VISITORS ARE ALLOWED IN THE SHORT STAY AREA OR RECOVERY ROOM!!**  IF YOU WILL BE ADMITTED INTO THE HOSPITAL YOU ARE ALLOWED ONLY TWO SUPPORT PEOPLE DURING VISITATION HOURS ONLY (10AM -8PM)   The support person(s) may change daily. The support person(s) must pass our screening, gel in and out, and wear a mask at all times, including in the patient's room. Patients must also wear a mask when staff or their support person are in the room.  No visitors under the age of 57. Any visitor under the age of 52 must be accompanied by an adult.    COVID SWAB TESTING MUST BE COMPLETED ON:  08/04/21    [place] [facility] (backside of the building) No appointment needed. Open 8am-3pm You are not required to quarantine, however you are required to wear a well-fitted mask when you are out and around people not in your household.  Hand Hygiene often Do NOT share personal items Notify your provider if you are in close contact with someone who has COVID or you develop fever 100.4 or greater, new onset of sneezing, cough, sore throat, shortness of breath or body aches.       Your procedure is scheduled on: 08/06/21   Report to [facility] Main  Entrance   Report to Short Stay at 5:15 AM   [facility])   Call this number if you have problems the morning of surgery [phone]   Do not eat food :After Midnight.   May have liquids until 4:30 AM day of surgery  CLEAR LIQUID DIET  Foods Allowed                                                                     Foods Excluded  Water, Black Coffee and tea (NO milk or creamer)           liquids that you cannot  Plain Jell-O in any flavor  (No red)                                     see through such as: Fruit ices (not with fruit pulp)                                            milk, coffee creamer,  soups, orange juice              Iced Popsicles (No red)                                               All solid food  Apple juices Sports drinks like Gatorade (No red) Lightly seasoned clear broth or consume(fat free) Sugar, honey syrup   The day of surgery:  Drink ONE (1) Pre-Surgery Clear Ensure by 4:30 am the morning of surgery. Drink in one sitting. Do not sip.  This drink was given to you during your hospital  pre-op appointment visit. Nothing else to drink after completing the  Pre-Surgery Clear Ensure.          If you have questions, please contact your surgeon's office.      Oral Hygiene is also important to reduce your risk of infection.                                    Remember - BRUSH YOUR TEETH THE MORNING OF SURGERY WITH YOUR REGULAR TOOTHPASTE   Take these medicines the morning of surgery with A SIP OF WATER: Inhalers, Pepcid.                               You may not have any metal on your body including hair pins, jewelry, and body piercing             Do not wear make-up, lotions, powders, perfumes, or deodorant  Do not wear nail polish including gel and S&S, artificial/acrylic nails, or any other type of covering on natural nails including finger and toenails. If you have artificial nails, gel coating, etc. that needs to be removed by a nail salon please have this removed prior to surgery or surgery may need to be canceled/ delayed if the surgeon/ anesthesia feels like they are unable to be safely monitored.   Do not shave  48 hours prior to surgery.    Do not bring valuables to the hospital. [place].   Bring small overnight bag day of surgery.    Please read over the following fact sheets you were given: IF YOU HAVE QUESTIONS ABOUT YOUR PRE OP INSTRUCTIONS PLEASE CALL [facility] - Preparing for Surgery Before surgery, you can play an important role.   Because skin is not sterile, your skin needs to be as free of germs as possible.  You can reduce the number of germs on your skin by washing with CHG (chlorahexidine gluconate) soap before surgery.  CHG is an antiseptic cleaner which kills germs and bonds with the skin to continue killing germs even after washing. Please DO NOT use if you have an allergy to CHG or antibacterial soaps.  If your skin becomes reddened/irritated stop using the CHG and inform your nurse when you arrive at Short Stay. Do not shave (including legs and underarms) for at least 48 hours prior to the first CHG shower.  You may shave your face/neck.  Please follow these instructions carefully:  1.  Shower with CHG Soap the night before surgery and the  morning of surgery.  2.  If you choose to wash your hair, wash your hair first as usual with your normal  shampoo.  3.  After you shampoo, rinse your hair and body thoroughly to remove the shampoo.  4.  Use CHG as you would any other liquid soap.  You can apply chg directly to the skin and wash.  Gently with a scrungie or clean washcloth.  5.  Apply the CHG Soap to your body ONLY FROM THE NECK DOWN.   Do   not use on face/ open                           Wound or open sores. Avoid contact with eyes, ears mouth and   genitals (private parts).                       Wash face,  Genitals (private parts) with your normal soap.             6.  Wash thoroughly, paying special attention to the area where your    surgery  will be performed.  7.  Thoroughly rinse your body with warm water from the neck down.  8.  DO NOT shower/wash with your normal soap after using and rinsing off the CHG Soap.                9.  Pat yourself dry with a clean towel.            10.  Wear clean pajamas.            11.  Place clean sheets on your bed the night of your first shower and do not  sleep with pets. Day of Surgery : Do not apply any lotions/deodorants the morning of  surgery.  Please wear clean clothes to the hospital/surgery center.  FAILURE TO FOLLOW THESE INSTRUCTIONS MAY RESULT IN THE CANCELLATION OF YOUR SURGERY  PATIENT SIGNATURE_________________________________  NURSE SIGNATURE__________________________________  ________________________________________________________________________

## 2021-08-04 NOTE — Progress Notes (Signed)
Patient was called this morning regarding getting a COVID test today for upcoming surgery. Patient was given the address and hours of operation of COVID site. She said she would do her best to ger there today. Writer attempted to call patient and ask if she was able to get in today. Call went straight to voicemail. Will inquire about COVID test at PAT appointment tomorrow at 0800.

## 2021-08-04 NOTE — Progress Notes (Addendum)
COVID swab appointment:08/04/21 pt reports she got it done in the afternoon  COVID Vaccine Completed: yes x2 Date COVID Vaccine completed: 03/01/20, 03/22/20 Has received booster: COVID vaccine manufacturer: Pfizer      Date of COVID positive in last 90 days: No  PCP - Mackie Pai, PA-C Cardiologist - N/a  Chest x-ray - 01/19/21 Epic EKG - 01/20/21 Epic Stress Test - 10 years ago per pt ECHO - 10 years ago per pt Cardiac Cath - N/a Pacemaker/ICD device last checked: N/a  Spinal Cord Stimulator: N/a  Sleep Study - N/a CPAP -   Fasting Blood Sugar - N/a per pt A1C 6.0 07/24/21 Checks Blood Sugar _____ times a day  Blood Thinner Instructions: ASA with caffiene Aspirin Instructions: Last Dose: last week per pt  Activity level: Can go up a flight of stairs and perform activities of daily living without stopping and without symptoms of chest pain or shortness of breath.       Anesthesia review: HTN, pt BP at PAT 162/97 and 169/94. Patient denied any symptoms and reports her BP usually gets high at appointments. She also recalled that she forgot to take her BP medication this morning. Patient was recently changed to new BP medication by her PCP and follows up. Instructed patient to be sure she takes her medication when she gets home.  Patient denies shortness of breath, fever, cough and chest pain at PAT appointment   Patient verbalized understanding of instructions that were given to them at the PAT appointment. Patient was also instructed that they will need to review over the PAT instructions again at home before surgery.

## 2021-08-05 ENCOUNTER — Encounter (HOSPITAL_COMMUNITY): Payer: Self-pay | Admitting: Registered Nurse

## 2021-08-05 ENCOUNTER — Encounter (HOSPITAL_COMMUNITY): Payer: Self-pay | Admitting: Surgery

## 2021-08-05 ENCOUNTER — Encounter (HOSPITAL_COMMUNITY): Payer: Self-pay

## 2021-08-05 ENCOUNTER — Encounter (HOSPITAL_COMMUNITY)
Admission: RE | Admit: 2021-08-05 | Discharge: 2021-08-05 | Disposition: A | Discharge status: Home / Self Care | Payer: BC Managed Care – PPO | Source: Ambulatory Visit | Attending: Surgery | Admitting: Surgery

## 2021-08-05 ENCOUNTER — Other Ambulatory Visit: Payer: Self-pay | Admitting: Surgery

## 2021-08-05 ENCOUNTER — Other Ambulatory Visit: Payer: Self-pay

## 2021-08-05 ENCOUNTER — Encounter (HOSPITAL_COMMUNITY): Payer: Self-pay | Admitting: Physician Assistant

## 2021-08-05 DIAGNOSIS — U071 COVID-19: Secondary | ICD-10-CM | POA: Diagnosis not present

## 2021-08-05 DIAGNOSIS — K8010 Calculus of gallbladder with chronic cholecystitis without obstruction: Secondary | ICD-10-CM | POA: Insufficient documentation

## 2021-08-05 DIAGNOSIS — Z7982 Long term (current) use of aspirin: Secondary | ICD-10-CM | POA: Diagnosis not present

## 2021-08-05 DIAGNOSIS — I10 Essential (primary) hypertension: Secondary | ICD-10-CM | POA: Insufficient documentation

## 2021-08-05 DIAGNOSIS — Z79899 Other long term (current) drug therapy: Secondary | ICD-10-CM | POA: Diagnosis not present

## 2021-08-05 DIAGNOSIS — Z7901 Long term (current) use of anticoagulants: Secondary | ICD-10-CM | POA: Diagnosis not present

## 2021-08-05 DIAGNOSIS — J45909 Unspecified asthma, uncomplicated: Secondary | ICD-10-CM | POA: Insufficient documentation

## 2021-08-05 DIAGNOSIS — Z01812 Encounter for preprocedural laboratory examination: Secondary | ICD-10-CM | POA: Insufficient documentation

## 2021-08-05 HISTORY — DX: Essential (primary) hypertension: I10

## 2021-08-05 HISTORY — DX: Anemia, unspecified: D64.9

## 2021-08-05 LAB — SARS CORONAVIRUS 2 (TAT 6-24 HRS): SARS Coronavirus 2: POSITIVE — AB

## 2021-08-05 NOTE — Progress Notes (Signed)
Spoke with Whitney Gray on answering service at 1800  Dr. Bobbye Morton returned page at [phone].  Verbalized understanding of +covid test result on 8/24.

## 2021-08-05 NOTE — Anesthesia Preprocedure Evaluation (Deleted)
Anesthesia Evaluation  Patient identified by MRN, date of birth, ID band Patient awake    Reviewed: Allergy & Precautions, NPO status , Patient's Chart, lab work & pertinent test results  Airway        Dental   Pulmonary asthma ,           Cardiovascular hypertension, Pt. on medications + Valvular Problems/Murmurs      Neuro/Psych PSYCHIATRIC DISORDERS    GI/Hepatic negative GI ROS, Neg liver ROS,   Endo/Other  negative endocrine ROS  Renal/GU negative Renal ROS     Musculoskeletal  (+) Arthritis ,   Abdominal (+) + obese (BMI 38.58),   Peds  Hematology  (+) Sickle cell trait and anemia , Lab Results      Component                Value               Date                      WBC                      6.5                 07/24/2021                HGB                      11.0 (L)            07/24/2021                HCT                      34.8                07/24/2021                MCV                      83                  07/24/2021                PLT                      215                 07/24/2021              Anesthesia Other Findings All: Latex, Codeine  Reproductive/Obstetrics                            Anesthesia Physical Anesthesia Plan  ASA: 2  Anesthesia Plan: General   Post-op Pain Management:    Induction: Intravenous  PONV Risk Score and Plan: 4 or greater and Treatment may vary due to age or medical condition, Ondansetron, Dexamethasone and Midazolam  Airway Management Planned: Oral ETT  Additional Equipment: None  Intra-op Plan:   Post-operative Plan: Extubation in OR  Informed Consent:     Dental advisory given  Plan Discussed with: CRNA  Anesthesia Plan Comments: (See PAT note 08/05/2021, Konrad Felix Ward, PA-C)       Anesthesia Quick Evaluation

## 2021-08-05 NOTE — H&P (Signed)
REFERRING PHYSICIAN: Saguier, Meriam Sprague, PA  PROVIDER: Gomez Cleverly, MD  MRN: [id number] DOB: November 03, 1971 DATE OF ENCOUNTER: 07/28/2021  Subjective   Chief Complaint: Cholelithiasis and New Consultation (gallstones)   History of Present Illness:  Patient is referred by Mackie Pai, PA-C, for surgical evaluation and management of symptomatic cholelithiasis and chronic cholecystitis. Patient presents with a 1 year history of intermittent epigastric and right upper quadrant abdominal pain. This is associated with nausea and vomiting. It can last for several hours. She has been seen at the urgent care on 3 separate occasions. Patient had an ultrasound performed on July 15, 2021. This showed multiple gallstones with the largest measuring 1.7 cm in size. There was no sign of acute inflammation. Previous abdominal surgery includes cesarean section. Patient denies any history of jaundice or acholic stools. She has had fever on at least 1 occasion. There is a family history of gallbladder disease in the patient's mother and her son. She presents today to discuss cholecystectomy for symptomatic cholelithiasis and chronic cholecystitis. 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 I have reviewed this information and discussed as appropriate with the patient. See HPI as well for other ROS.  Review of Systems  Constitutional: Negative.  HENT: Negative.  Eyes: Negative.  Respiratory: Negative.  Cardiovascular: Negative.  Gastrointestinal: Positive for abdominal pain, nausea and vomiting.  Genitourinary: Negative.  Musculoskeletal: Negative.  Skin: Negative.  Neurological: Negative.  Endo/Heme/Allergies: Negative.  Psychiatric/Behavioral: Negative.    Medical History: Past Medical History:  Diagnosis Date   Allergic state   Obesity   Pituitary adenoma (CMS-HCC)   Sickle cell trait (CMS-HCC)    Varicose veins  lower extremities   Patient Active Problem List  Diagnosis   Pituitary adenoma (CMS-HCC)   Vitamin D insufficiency   Sickle cell trait (CMS-HCC)   Varicose veins   Varicose vein of leg   Allergic rhinitis, unspecified allergic rhinitis type   Calculus of gallbladder with chronic cholecystitis without obstruction   Past Surgical History:  Procedure Laterality Date   crowns- upper front dentition    Allergies  Allergen Reactions   Latex Rash   Codeine Vomiting   No current outpatient medications on file prior to visit.   No current facility-administered medications on file prior to visit.   No family history on file.   Social History   Tobacco Use  Smoking Status Never Smoker  Smokeless Tobacco Never Used    Social History   Socioeconomic History   Marital status: Married  Tobacco Use   Smoking status: Never Smoker   Smokeless tobacco: Never Used  Substance and Sexual Activity   Alcohol use: No  Alcohol/week: 0.0 standard drinks   Drug use: No   Sexual activity: Yes  Partners: Male  Social History Narrative  Works as a Emergency planning/management officer and [employer]   Objective:   Vitals:  07/28/21 1334  BP: 128/64  Pulse: 85  Temp: 36.4 C (97.5 F)  SpO2: 98%  Weight: 98.7 kg (217 lb 9.6 oz)  Height: 160 cm ('5\' 3"'$ )   Body mass index is 38.55 kg/m.  Physical Exam   GENERAL APPEARANCE Development: normal Nutritional status: normal Gross deformities: none  SKIN Rash, lesions, ulcers: none Induration, erythema: none Nodules: none palpable  EYES Conjunctiva and lids: normal Pupils: equal and reactive Iris: normal bilaterally  EARS, NOSE, MOUTH, THROAT External ears: no lesion or deformity External  nose: no lesion or deformity Hearing: grossly normal Due to Covid-19 pandemic, patient is wearing a mask.  NECK Symmetric: yes Trachea: midline Thyroid: no palpable nodules in the thyroid bed  CHEST Respiratory effort:  normal Retraction or accessory muscle use: no Breath sounds: normal bilaterally Rales, rhonchi, wheeze: none  CARDIOVASCULAR Auscultation: regular rhythm, normal rate Murmurs: none Pulses: radial pulse 2+ palpable Lower extremity edema: none  ABDOMEN Distension: none Masses: none palpable Tenderness: Mild tenderness to deep palpation in right upper quadrant Hepatosplenomegaly: not present Hernia: not present  MUSCULOSKELETAL Station and gait: normal Digits and nails: no clubbing or cyanosis Muscle strength: grossly normal all extremities Range of motion: grossly normal all extremities Deformity: none  LYMPHATIC Cervical: none palpable Supraclavicular: none palpable  PSYCHIATRIC Oriented to person, place, and time: yes Mood and affect: normal for situation Judgment and insight: appropriate for situation  Assessment and Plan:  Diagnoses and all orders for this visit:  Calculus of gallbladder with chronic cholecystitis without obstruction   Patient presents with a 1 year history of intermittent right upper quadrant abdominal pain consistent with chronic cholecystitis and cholelithiasis. Ultrasound confirms cholelithiasis. Today we discussed proceeding with laparoscopic cholecystectomy with intraoperative cholangiography as the treatment of choice. We discussed risk and benefits of surgery including the potential for open surgery. We discussed the hospital stay to be anticipated. We discussed the postoperative recovery and return to work and activities. The patient understands and wishes to proceed with surgery in the near future.  The risks and benefits of the procedure have been discussed at length with the patient. The patient understands the proposed procedure, potential alternative treatments, and the course of recovery to be expected. All of the patient's questions have been answered at this time. The patient wishes to proceed with surgery.   Armandina Gemma, MD [facility] [place] [facility] Office: [phone]

## 2021-08-05 NOTE — Progress Notes (Signed)
Anesthesia Chart Review   Case: [id number] Date/Time: 08/06/21 0957   Procedure: LAPAROSCOPIC CHOLECYSTECTOMY WITH INTRAOPERATIVE CHOLANGIOGRAM   Anesthesia type: General   Pre-op diagnosis: CHRONIC CHOLECYSTITIS, CHOLELITHIASIS   Location: [facility] ROOM 06 / [facility]   Surgeons: Armandina Gemma, MD       DISCUSSION:50 y.o. never smoker with h/o asthma, HTN, chronic cholecystitis, cholelithiasis scheduled for above procedure 08/06/2021 with Dr. Armandina Gemma.   Pt seen by PCP 07/24/2021. Elevated BP at that time, she had been off HTN medications for 1 year. Started on losartan hctz.  Elevated BP at PAT visit.  Pt reports she did not take her medications today.  She reports pressure elevated at medical visits.  She has been checking her pressures at home with better readings.  Surgery is scheduled for tomorrow, will evaluate BP DOS.  Discussed risk of cancellation with pt and need for follow up with PCP.   BP Readings from Last 3 Encounters:  08/05/21 (!) 162/97  07/24/21 (!) 170/88  07/22/21 (!) 165/83    VS: BP (!) 162/97   Pulse 65   Temp 37 C (Oral)   Resp 18   Ht '5\' 3"'$  (1.6 m)   Wt 98.8 kg   LMP  (LMP Unknown)   SpO2 100%   BMI 38.58 kg/m   PROVIDERS: Saguier, Iris Pert is PCP    LABS: Labs reviewed: Acceptable for surgery. (all labs ordered are listed, but only abnormal results are displayed)  Labs Reviewed - No data to display   IMAGES:   EKG: 01/20/2021 Rate 65 bpm  Sinus rhtythm with 1st degree AV block  Minimal voltage criteria for LVH, may be normal variant  CV:  Past Medical History:  Diagnosis Date   Amenorrhea    Anemia    Arthritis    Bilateral knees   Asthma    Depression with anxiety 03/25/2020   Heart murmur    Hypertension    Pituitary adenoma ([place])    Prolactinoma ([place])     Past Surgical History:  Procedure Laterality Date   CESAREAN SECTION  2004   TUBAL LIGATION      MEDICATIONS:  albuterol (VENTOLIN HFA) 108 (90 Base) MCG/ACT inhaler    Aspirin-Acetaminophen-Caffeine (GOODY HEADACHE PO)   Aspirin-Caffeine (BC FAST PAIN RELIEF PO)   Azelastine-Fluticasone (DYMISTA) 137-50 MCG/ACT SUSP   bismuth subsalicylate (PEPTO BISMOL) 262 MG/15ML suspension   cyanocobalamin (,VITAMIN B-12,) 1000 MCG/ML injection   famotidine (PEPCID) 20 MG tablet   fluticasone (FLONASE) 50 MCG/ACT nasal spray   hydrochlorothiazide (MICROZIDE) 12.5 MG capsule   ibuprofen (ADVIL) 200 MG tablet   losartan-hydrochlorothiazide (HYZAAR) 50-12.5 MG tablet   oxymetazoline (AFRIN) 0.05 % nasal spray   SYRINGE-NEEDLE, DISP, 3 ML (EASY TOUCH SAFETY SYRINGE) 22G X 1" 3 ML MISC   No current facility-administered medications for this encounter.     Konrad Felix Ward, PA-C [facility] [phone]

## 2021-08-06 ENCOUNTER — Encounter (HOSPITAL_COMMUNITY): Admission: RE | Payer: Self-pay | Source: Ambulatory Visit

## 2021-08-06 ENCOUNTER — Ambulatory Visit (HOSPITAL_COMMUNITY): Admission: RE | Admit: 2021-08-06 | Payer: BC Managed Care – PPO | Source: Ambulatory Visit | Admitting: Surgery

## 2021-08-06 SURGERY — LAPAROSCOPIC CHOLECYSTECTOMY WITH INTRAOPERATIVE CHOLANGIOGRAM
Anesthesia: General

## 2021-08-07 ENCOUNTER — Ambulatory Visit: Payer: BC Managed Care – PPO | Admitting: Medical

## 2021-08-07 ENCOUNTER — Telehealth: Payer: Self-pay | Admitting: Medical

## 2021-08-07 ENCOUNTER — Telehealth (INDEPENDENT_AMBULATORY_CARE_PROVIDER_SITE_OTHER): Payer: BC Managed Care – PPO | Admitting: Medical

## 2021-08-07 ENCOUNTER — Other Ambulatory Visit: Payer: Self-pay

## 2021-08-07 DIAGNOSIS — U071 COVID-19: Secondary | ICD-10-CM | POA: Diagnosis not present

## 2021-08-07 MED ORDER — MOLNUPIRAVIR EUA 200MG CAPSULE: 4.0000 | ORAL_CAPSULE | Freq: Two times a day (BID) | ORAL | 0 refills | Status: AC

## 2021-08-07 NOTE — Progress Notes (Signed)
   Subjective:    Patient ID: Whitney Gray, female    DOB: Feb 02, 1971, 50 y.o.   MRN: [id number]  HPI  Virtual Visit via Telephone Note  I connected with Nicholes Mango on 08/07/21 at  3:40 PM EDT by telephone and verified that I am speaking with the correct person using two identifiers.  Location: Patient: home Provider: office   I discussed the limitations, risks, security and privacy concerns of performing an evaluation and management service by telephone and the availability of in person appointments. I also discussed with the patient that there may be a patient responsible charge related to this service. The patient expressed understanding and agreed to proceed.   History of Present Illness: Pt states she had some sinus infection about twice a year. So recently with mild nasal congestion thought had allergies and sinus infection. Rapid covid test was negative on Thursday. She test early this week since before surgery on Tuesday pcr + before procedure.   Her symptoms were so mild she did not even know she had covid. She had covid twice in past and had covid vaccines.  Pt is going to get tested again before upcoming procedure. She will get test on 16 th before gallbladder removal.  Pt  has not checked her 02 sat or vitals.     Observations/Objective:(brief video connection but audio failed so had to call pt) General-no acute distress, pleasant, oriented. Lungs- on inspection lungs appear unlabored. Neck- no tracheal deviation or jvd on inspection. Neuro- gross motor function appears intact.    Assessment and Plan: Covid infection with mild symptoms. Covid vaccine x 2 and had covid twice in the past.   Discussed your concerns about follow up pcr prior to cholocystecomy upcoming. Antiviral montelukast discussed. Explained EAU designation and that can decrease viral count. In your case not absolutely indicated but you could choose to start. After benefit vs risk discussion you  decided to take molnupiravir. Rx sent.  Rest, hydrate and tylenol for fever.   Delsym for cough. Explained benefit of following 02 sat. If signs and symptoms change or worsen notify me.  Follow up in 7 days or sooner if needed.  Mackie Pai, PA-C   Time spent with patient today was  32 minutes which consisted of chart review, discussing diagnosis, work up treatment and documentation.   Follow Up Instructions:    I discussed the assessment and treatment plan with the patient. The patient was provided an opportunity to ask questions and all were answered. The patient agreed with the plan and demonstrated an understanding of the instructions.   The patient was advised to call back or seek an in-person evaluation if the symptoms worsen or if the condition fails to improve as anticipated.     Mackie Pai, PA-C   Review of Systems     Objective:   Physical Exam        Assessment & Plan:

## 2021-08-07 NOTE — Patient Instructions (Signed)
Covid infection with mild symptoms. Covid vaccine x 2 and had covid twice in the past.   Discussed your concerns about follow up pcr prior to cholocystecomy upcoming. Antiviral montelukast discussed. Explained EAU designation and that can decrease viral count. In your case not absolutely indicated but you could choose to start. After benefit vs risk discussion you decided to take molnupiravir. Rx sent.  Rest, hydrate and tylenol for fever.   Delsym for cough. Explained benefit of following 02 sat. If signs and symptoms change or worsen notify me.  Follow up in 7 days or sooner if needed.

## 2021-08-08 NOTE — Telephone Encounter (Signed)
Opened to review 

## 2021-08-11 ENCOUNTER — Telehealth: Payer: Self-pay

## 2021-08-11 NOTE — Telephone Encounter (Signed)
Pt had covid 8/256/22- okay for work note?

## 2021-08-11 NOTE — Telephone Encounter (Signed)
Pt called this morning needing a RTW letter for [facility] from where she had Covid last week.  She needs it to state she is ok to RTW today and is not contagious.  Pt stated she is not having any symptoms today.  Please email letter to her personal email at [email or web address]

## 2021-08-12 NOTE — Telephone Encounter (Signed)
Pt has some body aches still but no other sxs. She will retest and then give results in the morning.

## 2021-08-27 NOTE — Progress Notes (Signed)
COVID swab appointment: N/a   COVID Vaccine Completed: yes x2 Date COVID Vaccine completed: 03/01/20, 03/22/20 Has received booster: COVID vaccine manufacturer: Pfizer       Date of COVID positive in last 90 days: 08/05/21 Epic   PCP - Mackie Pai, PA-C Cardiologist - N/a   Chest x-ray - 01/19/21 Epic EKG - 01/20/21 Epic Stress Test - 10 years ago per pt ECHO - 10 years ago per pt Cardiac Cath - N/a Pacemaker/ICD device last checked: N/a  Spinal Cord Stimulator: N/a   Sleep Study - N/a CPAP -    Fasting Blood Sugar - N/a per pt A1C 6.0 07/24/21 Checks Blood Sugar _____ times a day   Blood Thinner Instructions: ASA with caffiene Aspirin Instructions: Last Dose: last week per pt   Activity level: Can go up a flight of stairs and perform activities of daily living without stopping and without symptoms of chest pain or shortness of breath.                                                    Anesthesia review: HTN,    Patient denies shortness of breath, fever, cough and chest pain at PAT appointment     Patient verbalized understanding of instructions that were given to them at the PAT appointment. Patient was also instructed that they will need to review over the PAT instructions again at home before surgery.

## 2021-08-27 NOTE — Patient Instructions (Signed)
DUE TO COVID-19 ONLY ONE VISITOR IS ALLOWED TO COME WITH YOU AND STAY IN THE WAITING ROOM ONLY DURING PRE OP AND PROCEDURE.   **NO VISITORS ARE ALLOWED IN THE SHORT STAY AREA OR RECOVERY ROOM!!**  IF YOU WILL BE ADMITTED INTO THE HOSPITAL YOU ARE ALLOWED ONLY TWO SUPPORT PEOPLE DURING VISITATION HOURS ONLY (10AM -8PM)   The support person(s) may change daily. The support person(s) must pass our screening, gel in and out, and wear a mask at all times, including in the patient's room. Patients must also wear a mask when staff or their support person are in the room.  No visitors under the age of 64. Any visitor under the age of 4 must be accompanied by an adult.        Your procedure is scheduled on: 09/09/21   Report to [facility] Main  Entrance   Report to Short Stay at 5:15 AM   [facility])   Call this number if you have problems the morning of surgery [phone]   Do not eat food :After Midnight.   May have liquids until 4:15 AM day of surgery  CLEAR LIQUID DIET  Foods Allowed                                                                     Foods Excluded  Water, Black Coffee and tea (no milk or creamer)           liquids that you cannot  Plain Jell-O in any flavor  (No red)                                    see through such as: Fruit ices (not with fruit pulp)                                             milk, soups, orange juice              Iced Popsicles (No red)                                                All solid food                                   Apple juices Sports drinks like Gatorade (No red) Lightly seasoned clear broth or consume(fat free) Sugar     The day of surgery:  Drink ONE (1) Pre-Surgery Clear Ensure by 4:15 am the morning of surgery. Drink in one sitting. Do not sip.  This drink was given to you during your hospital  pre-op appointment visit. Nothing else to drink after completing the  Pre-Surgery Clear Ensure.          If  you have questions, please contact your surgeon's office.     Oral Hygiene is also important to  reduce your risk of infection.                                    Remember - BRUSH YOUR TEETH THE MORNING OF SURGERY WITH YOUR REGULAR TOOTHPASTE   Do NOT smoke after Midnight   Take these medicines the morning of surgery with A SIP OF WATER: Pepcid                              You may not have any metal on your body including hair pins, jewelry, and body piercing             Do not wear make-up, lotions, powders, perfumes, or deodorant  Do not wear nail polish including gel and S&S, artificial/acrylic nails, or any other type of covering on natural nails including finger and toenails. If you have artificial nails, gel coating, etc. that needs to be removed by a nail salon please have this removed prior to surgery or surgery may need to be canceled/ delayed if the surgeon/ anesthesia feels like they are unable to be safely monitored.   Do not shave  48 hours prior to surgery.              Do not bring valuables to the hospital. [place].   Contacts, dentures or bridgework may not be worn into surgery.   Bring small overnight bag day of surgery.   Special Instructions: Bring a copy of your healthcare power of attorney and living will documents         the day of surgery if you haven't scanned them in before.   Please read over the following fact sheets you were given: IF YOU HAVE QUESTIONS ABOUT YOUR PRE OP INSTRUCTIONS PLEASE CALL [phone]- [facility] - Preparing for Surgery Before surgery, you can play an important role.  Because skin is not sterile, your skin needs to be as free of germs as possible.  You can reduce the number of germs on your skin by washing with CHG (chlorahexidine gluconate) soap before surgery.  CHG is an antiseptic cleaner which kills germs and bonds with the skin to continue killing germs even after  washing. Please DO NOT use if you have an allergy to CHG or antibacterial soaps.  If your skin becomes reddened/irritated stop using the CHG and inform your nurse when you arrive at Short Stay. Do not shave (including legs and underarms) for at least 48 hours prior to the first CHG shower.  You may shave your face/neck.  Please follow these instructions carefully:  1.  Shower with CHG Soap the night before surgery and the  morning of surgery.  2.  If you choose to wash your hair, wash your hair first as usual with your normal  shampoo.  3.  After you shampoo, rinse your hair and body thoroughly to remove the shampoo.                             4.  Use CHG as you would any other liquid soap.  You can apply chg directly to the skin and wash.  Gently with a scrungie or clean washcloth.  5.  Apply  the CHG Soap to your body ONLY FROM THE NECK DOWN.   Do   not use on face/ open                           Wound or open sores. Avoid contact with eyes, ears mouth and   genitals (private parts).                       Wash face,  Genitals (private parts) with your normal soap.             6.  Wash thoroughly, paying special attention to the area where your    surgery  will be performed.  7.  Thoroughly rinse your body with warm water from the neck down.  8.  DO NOT shower/wash with your normal soap after using and rinsing off the CHG Soap.                9.  Pat yourself dry with a clean towel.            10.  Wear clean pajamas.            11.  Place clean sheets on your bed the night of your first shower and do not  sleep with pets. Day of Surgery : Do not apply any lotions/deodorants the morning of surgery.  Please wear clean clothes to the hospital/surgery center.  FAILURE TO FOLLOW THESE INSTRUCTIONS MAY RESULT IN THE CANCELLATION OF YOUR SURGERY  PATIENT SIGNATURE_________________________________  NURSE  SIGNATURE__________________________________  ________________________________________________________________________

## 2021-08-28 ENCOUNTER — Encounter (HOSPITAL_COMMUNITY)
Admission: RE | Admit: 2021-08-28 | Discharge: 2021-08-28 | Disposition: A | Discharge status: Home / Self Care | Payer: BC Managed Care – PPO | Source: Ambulatory Visit | Attending: Anesthesiology | Admitting: Anesthesiology

## 2021-09-02 ENCOUNTER — Other Ambulatory Visit: Payer: Self-pay | Admitting: Surgery

## 2021-09-02 LAB — SARS CORONAVIRUS 2 (TAT 6-24 HRS): SARS Coronavirus 2: NEGATIVE

## 2021-09-04 ENCOUNTER — Encounter (HOSPITAL_COMMUNITY)
Admission: RE | Admit: 2021-09-04 | Discharge: 2021-09-04 | Disposition: A | Discharge status: Home / Self Care | Payer: BC Managed Care – PPO | Source: Ambulatory Visit | Attending: Surgery | Admitting: Surgery

## 2021-09-04 ENCOUNTER — Telehealth: Payer: Self-pay | Admitting: Medical

## 2021-09-04 ENCOUNTER — Encounter (HOSPITAL_COMMUNITY): Payer: Self-pay

## 2021-09-04 DIAGNOSIS — Z01812 Encounter for preprocedural laboratory examination: Secondary | ICD-10-CM | POA: Insufficient documentation

## 2021-09-04 LAB — CBC
HCT: 35.7 % — ABNORMAL LOW (ref 36.0–46.0)
Hemoglobin: 11.1 g/dL — ABNORMAL LOW (ref 12.0–15.0)
MCH: 26.9 pg (ref 26.0–34.0)
MCHC: 31.1 g/dL (ref 30.0–36.0)
MCV: 86.4 fL (ref 80.0–100.0)
Platelets: 229 10*3/uL (ref 150–400)
RBC: 4.13 MIL/uL (ref 3.87–5.11)
RDW: 15.3 % (ref 11.5–15.5)
WBC: 6 10*3/uL (ref 4.0–10.5)
nRBC: 0 % (ref 0.0–0.2)

## 2021-09-04 LAB — BASIC METABOLIC PANEL
Anion gap: 6 (ref 5–15)
BUN: 15 mg/dL (ref 6–20)
CO2: 26 mmol/L (ref 22–32)
Calcium: 9.3 mg/dL (ref 8.9–10.3)
Chloride: 111 mmol/L (ref 98–111)
Creatinine, Ser: 0.88 mg/dL (ref 0.44–1.00)
GFR, Estimated: >60 mL/min (ref >60)
Glucose, Bld: 100 mg/dL — ABNORMAL HIGH (ref 70–99)
Potassium: 4.2 mmol/L (ref 3.5–5.1)
Sodium: 143 mmol/L (ref 135–145)

## 2021-09-04 NOTE — Progress Notes (Addendum)
COVID swab appointment: N/a  COVID Vaccine Completed: yes x2 Date COVID Vaccine completed: 03/22/20, 03/01/20 Has received booster: COVID vaccine manufacturer: Pfizer       Date of COVID positive in last 90 days: 08/05/21 Epic  PCP - Mackie Pai, PA-C Cardiologist - N/a  Chest x-ray - 01/19/21 Epic EKG - 01/20/21 Epic Stress Test - yes 12 years ago per pt ECHO - yes 12 years ago per pt Cardiac Cath - N/a Pacemaker/ICD device last checked: N/a Spinal Cord Stimulator: N/a  Sleep Study - N/a CPAP -   Fasting Blood Sugar - N/a Checks Blood Sugar _____ times a day  Blood Thinner Instructions:  Aspirin Instructions: ASA with caffeine , not taking Last Dose:  Activity level: Can go up a flight of stairs and perform activities of daily living without stopping and without symptoms of chest pain or shortness of breath.      Anesthesia review: HTN, PAT BP 206/116, 190/106, 177/103. Patient did not take her BP med this morning. Denied any symptoms. Janett Billow, [place] came and spoke to patient. Her BP numbers were given to her and she is to call her PCP today to discuss further treatment. Patient will take her BP med and check BP at home.  Patient denies shortness of breath, fever, cough and chest pain at PAT appointment   Patient verbalized understanding of instructions that were given to them at the PAT appointment. Patient was also instructed that they will need to review over the PAT instructions again at home before surgery.

## 2021-09-04 NOTE — Patient Instructions (Signed)
DUE TO COVID-19 ONLY ONE VISITOR IS ALLOWED TO COME WITH YOU AND STAY IN THE WAITING ROOM ONLY DURING PRE OP AND PROCEDURE.   **NO VISITORS ARE ALLOWED IN THE SHORT STAY AREA OR RECOVERY ROOM!!**  IF YOU WILL BE ADMITTED INTO THE HOSPITAL YOU ARE ALLOWED ONLY TWO SUPPORT PEOPLE DURING VISITATION HOURS ONLY (10AM -8PM)   The support person(s) may change daily. The support person(s) must pass our screening, gel in and out, and wear a mask at all times, including in the patient's room. Patients must also wear a mask when staff or their support person are in the room.  No visitors under the age of 71. Any visitor under the age of 90 must be accompanied by an adult.        Your procedure is scheduled on: 09/09/21   Report to [facility] Main Entrance   Report to Short Stay at 5:15 AM   [facility])   Call this number if you have problems the morning of surgery [phone]   Do not eat food :After Midnight.   May have liquids until 4:15 AM day of surgery  CLEAR LIQUID DIET  Foods Allowed                                                                     Foods Excluded  Water, Black Coffee and tea (no milk or creamer)            liquids that you cannot  Plain Jell-O in any flavor  (No red)                                     see through such as: Fruit ices (not with fruit pulp)                                             milk, soups, orange juice              Iced Popsicles (No red)                                                 All solid food                                   Apple juices Sports drinks like Gatorade (No red) Lightly seasoned clear broth or consume(fat free) Sugar     The day of surgery:  Drink ONE (1) Pre-Surgery Clear Ensure by 4:15 am the morning of surgery. Drink in one sitting. Do not sip.  This drink was given to you during your hospital  pre-op appointment visit. Nothing else to drink after completing the  Pre-Surgery Clear Ensure.          If  you have questions, please contact your surgeon's office.     Oral Hygiene is also  important to reduce your risk of infection.                                    Remember - BRUSH YOUR TEETH THE MORNING OF SURGERY WITH YOUR REGULAR TOOTHPASTE   Take these medicines the morning of surgery with A SIP OF WATER: Pepcid  DO NOT TAKE ANY ORAL DIABETIC MEDICATIONS DAY OF YOUR SURGERY                              You may not have any metal on your body including hair pins, jewelry, and body piercing             Do not wear make-up, lotions, powders, perfumes, or deodorant  Do not wear nail polish including gel and S&S, artificial/acrylic nails, or any other type of covering on natural nails including finger and toenails. If you have artificial nails, gel coating, etc. that needs to be removed by a nail salon please have this removed prior to surgery or surgery may need to be canceled/ delayed if the surgeon/ anesthesia feels like they are unable to be safely monitored.   Do not shave  48 hours prior to surgery.    Do not bring valuables to the hospital. [place].   Bring small overnight bag day of surgery.   Please read over the following fact sheets you were given: IF YOU HAVE QUESTIONS ABOUT YOUR PRE-OP INSTRUCTIONS PLEASE CALL [facility] - Preparing for Surgery Before surgery, you can play an important role.  Because skin is not sterile, your skin needs to be as free of germs as possible.  You can reduce the number of germs on your skin by washing with CHG (chlorahexidine gluconate) soap before surgery.  CHG is an antiseptic cleaner which kills germs and bonds with the skin to continue killing germs even after washing. Please DO NOT use if you have an allergy to CHG or antibacterial soaps.  If your skin becomes reddened/irritated stop using the CHG and inform your nurse when you arrive at Short Stay. Do not shave  (including legs and underarms) for at least 48 hours prior to the first CHG shower.  You may shave your face/neck.  Please follow these instructions carefully:  1.  Shower with CHG Soap the night before surgery and the  morning of surgery.  2.  If you choose to wash your hair, wash your hair first as usual with your normal  shampoo.  3.  After you shampoo, rinse your hair and body thoroughly to remove the shampoo.                             4.  Use CHG as you would any other liquid soap.  You can apply chg directly to the skin and wash.  Gently with a scrungie or clean washcloth.  5.  Apply the CHG Soap to your body ONLY FROM THE NECK DOWN.   Do   not use on face/ open                           Wound or open sores. Avoid  contact with eyes, ears mouth and   genitals (private parts).                       Wash face,  Genitals (private parts) with your normal soap.             6.  Wash thoroughly, paying special attention to the area where your    surgery  will be performed.  7.  Thoroughly rinse your body with warm water from the neck down.  8.  DO NOT shower/wash with your normal soap after using and rinsing off the CHG Soap.                9.  Pat yourself dry with a clean towel.            10.  Wear clean pajamas.            11.  Place clean sheets on your bed the night of your first shower and do not  sleep with pets. Day of Surgery : Do not apply any lotions/deodorants the morning of surgery.  Please wear clean clothes to the hospital/surgery center.  FAILURE TO FOLLOW THESE INSTRUCTIONS MAY RESULT IN THE CANCELLATION OF YOUR SURGERY  PATIENT SIGNATURE_________________________________  NURSE SIGNATURE__________________________________  ________________________________________________________________________

## 2021-09-04 NOTE — Telephone Encounter (Signed)
Pt called she visited [facility] on 9/23. Her blood pressure readings were 206/116, 190/106, and 177/103. Pt has surgery on wed 9/28 but may not be able to proceed because her blood pressure is so high. Pt stated they informed her she may need beta blockers so it will not interfere with her heart and arteries. Please advise.

## 2021-09-04 NOTE — Telephone Encounter (Signed)
FYI   Appointment made for 9/27

## 2021-09-07 ENCOUNTER — Inpatient Hospital Stay (HOSPITAL_BASED_OUTPATIENT_CLINIC_OR_DEPARTMENT_OTHER): Admission: RE | Admit: 2021-09-07 | Payer: BC Managed Care – PPO | Source: Ambulatory Visit

## 2021-09-07 ENCOUNTER — Encounter (HOSPITAL_COMMUNITY): Payer: Self-pay | Admitting: Surgery

## 2021-09-07 NOTE — Telephone Encounter (Signed)
Called pt this morning she stated she forgot to check BP last night & was unable to take it this AM because someone moved the BP cuff in the house but here are her readings from the weekend, and patient doesn't any symptoms such as lightheadedness,dizziness , chest pain , patient currently states she only had lightheadedness on Friday    Friday night around 9pm - 124/67  127/89- Saturday AM 109/69 - Saturday PM

## 2021-09-07 NOTE — H&P (Signed)
REFERRING PHYSICIAN: Saguier, Meriam Sprague, PA  PROVIDER: Lannie Yusuf Charlotta Newton, MD   Chief Complaint: Cholelithiasis and New Consultation (gallstones)   History of Present Illness:  Patient is referred by Mackie Pai, PA-C, for surgical evaluation and management of symptomatic cholelithiasis and chronic cholecystitis. Patient presents with a 1 year history of intermittent epigastric and right upper quadrant abdominal pain. This is associated with nausea and vomiting. It can last for several hours. She has been seen at the urgent care on 3 separate occasions. Patient had an ultrasound performed on July 15, 2021. This showed multiple gallstones with the largest measuring 1.7 cm in size. There was no sign of acute inflammation. Previous abdominal surgery includes cesarean section. Patient denies any history of jaundice or acholic stools. She has had fever on at least 1 occasion. There is a family history of gallbladder disease in the patient's mother and her son. She presents today to discuss cholecystectomy for symptomatic cholelithiasis and chronic cholecystitis. Patient works in TEFL teacher at [employer]: A complete review of systems was obtained from the patient. I have reviewed this information and discussed as appropriate with the patient. See HPI as well for other ROS.  Review of Systems  Constitutional: Negative.  HENT: Negative.  Eyes: Negative.  Respiratory: Negative.  Cardiovascular: Negative.  Gastrointestinal: Positive for abdominal pain, nausea and vomiting.  Genitourinary: Negative.  Musculoskeletal: Negative.  Skin: Negative.  Neurological: Negative.  Endo/Heme/Allergies: Negative.  Psychiatric/Behavioral: Negative.    Medical History: Past Medical History:  Diagnosis Date   Allergic state   Obesity   Pituitary adenoma (CMS-HCC)   Sickle cell trait (CMS-HCC)   Varicose veins  lower extremities   Patient Active Problem List  Diagnosis    Pituitary adenoma (CMS-HCC)   Vitamin D insufficiency   Sickle cell trait (CMS-HCC)   Varicose veins   Varicose vein of leg   Allergic rhinitis, unspecified allergic rhinitis type   Calculus of gallbladder with chronic cholecystitis without obstruction   Past Surgical History:  Procedure Laterality Date   crowns- upper front dentition    Allergies  Allergen Reactions   Latex Rash   Codeine Vomiting   No current outpatient medications on file prior to visit.   No current facility-administered medications on file prior to visit.   No family history on file.   Social History   Tobacco Use  Smoking Status Never Smoker  Smokeless Tobacco Never Used    Social History   Socioeconomic History   Marital status: Married  Tobacco Use   Smoking status: Never Smoker   Smokeless tobacco: Never Used  Substance and Sexual Activity   Alcohol use: No  Alcohol/week: 0.0 standard drinks   Drug use: No   Sexual activity: Yes  Partners: Male  Social History Narrative  Works as a Scientist, research (physical sciences)   Objective:   Vitals:  BP: 128/64  Pulse: 85  Temp: 36.4 C (97.5 F)  SpO2: 98%  Weight: 98.7 kg (217 lb 9.6 oz)  Height: 160 cm (5\' 3" )   Body mass index is 38.55 kg/m.  Physical Exam   GENERAL APPEARANCE Development: normal Nutritional status: normal Gross deformities: none  SKIN Rash, lesions, ulcers: none Induration, erythema: none Nodules: none palpable  EYES Conjunctiva and lids: normal Pupils: equal and reactive Iris: normal bilaterally  EARS, NOSE, MOUTH, THROAT External ears: no lesion or deformity External nose: no lesion or deformity Hearing: grossly normal Due to Covid-19 pandemic, patient is  wearing a mask.  NECK Symmetric: yes Trachea: midline Thyroid: no palpable nodules in the thyroid bed  CHEST Respiratory effort: normal Retraction or accessory muscle use: no Breath sounds: normal bilaterally Rales, rhonchi, wheeze:  none  CARDIOVASCULAR Auscultation: regular rhythm, normal rate Murmurs: none Pulses: radial pulse 2+ palpable Lower extremity edema: none  ABDOMEN Distension: none Masses: none palpable Tenderness: Mild tenderness to deep palpation in right upper quadrant Hepatosplenomegaly: not present Hernia: not present  MUSCULOSKELETAL Station and gait: normal Digits and nails: no clubbing or cyanosis Muscle strength: grossly normal all extremities Range of motion: grossly normal all extremities Deformity: none  LYMPHATIC Cervical: none palpable Supraclavicular: none palpable  PSYCHIATRIC Oriented to person, place, and time: yes Mood and affect: normal for situation Judgment and insight: appropriate for situation  Assessment and Plan:  Diagnoses and all orders for this visit:  Calculus of gallbladder with chronic cholecystitis without obstruction   Patient presents with a 1 year history of intermittent right upper quadrant abdominal pain consistent with chronic cholecystitis and cholelithiasis. Ultrasound confirms cholelithiasis. Today we discussed proceeding with laparoscopic cholecystectomy with intraoperative cholangiography as the treatment of choice. We discussed risk and benefits of surgery including the potential for open surgery. We discussed the hospital stay to be anticipated. We discussed the postoperative recovery and return to work and activities. The patient understands and wishes to proceed with surgery in the near future.  The risks and benefits of the procedure have been discussed at length with the patient. The patient understands the proposed procedure, potential alternative treatments, and the course of recovery to be expected. All of the patient's questions have been answered at this time. The patient wishes to proceed with surgery.  Armandina Gemma, MD [facility] A [place] practice Office: [phone]

## 2021-09-07 NOTE — Telephone Encounter (Signed)
I personally made the appointment myself on Friday, patient states she will bring a log in at the time of the visit ,and she didn't have any symptoms such as headache or dizziness and etc. When I called

## 2021-09-08 ENCOUNTER — Other Ambulatory Visit: Payer: Self-pay

## 2021-09-08 ENCOUNTER — Ambulatory Visit (INDEPENDENT_AMBULATORY_CARE_PROVIDER_SITE_OTHER): Payer: BC Managed Care – PPO | Admitting: Medical

## 2021-09-08 ENCOUNTER — Encounter: Payer: Self-pay | Admitting: Medical

## 2021-09-08 VITALS — BP 128/80 | HR 72 | Resp 18 | Ht 63.0 in | Wt 223.0 lb

## 2021-09-08 DIAGNOSIS — Z23 Encounter for immunization: Secondary | ICD-10-CM | POA: Diagnosis not present

## 2021-09-08 DIAGNOSIS — I10 Essential (primary) hypertension: Secondary | ICD-10-CM

## 2021-09-08 NOTE — Progress Notes (Signed)
Subjective:     Patient ID: Whitney Gray, female   DOB: 25-Feb-1971, 50 y.o.   MRN: [id number]  HPI   Pt in for follow up on her high blood pressure.  Pt bp level 207/116 at pre-op. Then later that day 120/61 at home. Then later checked and was 124/67. On Saturday was 127/89.   On pre-op visit she states did not drink coffee or energy drink before.   Pt felt tired day of preop. States was not nervous or in pain.  Surgery is scheduled for tomorrow morning. Pt last 2 days has been taking her bp meds in the morning.   Pt is only on losartan 50/12.5 mg daily.  Review of Systems  Constitutional:  Negative for chills, fatigue and fever.  Respiratory:  Negative for cough, chest tightness, shortness of breath and wheezing.   Cardiovascular:  Negative for chest pain and palpitations.  Gastrointestinal:  Negative for abdominal pain and blood in stool.  Genitourinary:  Negative for dyspareunia, dysuria and frequency.  Musculoskeletal:  Negative for back pain, gait problem and joint swelling.  Neurological:  Negative for dizziness, syncope, weakness, light-headedness and headaches.  Hematological:  Negative for adenopathy. Does not bruise/bleed easily.  Psychiatric/Behavioral:  Negative for behavioral problems and confusion.    Past Medical History:  Diagnosis Date   Amenorrhea    Anemia    Arthritis    Bilateral knees   Asthma    Depression with anxiety 03/25/2020   Heart murmur    Hypertension    Pituitary adenoma (HCC)    Prolactinoma ([place])      Social History   Socioeconomic History   Marital status: Married    Spouse name: Not on file   Number of children: 2   Years of education: 18   Highest education level: Not on file  Occupational History   Occupation: Med Tech  Tobacco Use   Smoking status: Never   Smokeless tobacco: Never  Vaping Use   Vaping Use: Never used  Substance and Sexual Activity   Alcohol use: No    Alcohol/week: 0.0 standard drinks   Drug use:  No   Sexual activity: Not Currently    Partners: Male    Birth control/protection: None  Other Topics Concern   Not on file  Social History Narrative   Fun: Play games   Denies abuse and feels safe at home.   Denies religious beliefs effecting health care.    Social Determinants of Health   Financial Resource Strain: Not on file  Food Insecurity: Not on file  Transportation Needs: Not on file  Physical Activity: Not on file  Stress: Not on file  Social Connections: Not on file  Intimate Partner Violence: Not on file    Past Surgical History:  Procedure Laterality Date   CESAREAN SECTION  2004   TUBAL LIGATION      Family History  Problem Relation Age of Onset   Asthma Mother    Alcohol abuse Father    Arthritis Father    Heart disease Father    Prostate cancer Maternal Grandfather    Diabetes Maternal Grandfather     Allergies  Allergen Reactions   Latex Rash   Codeine Nausea Only    Per pt,nausea,vomiting,dizzy.     Current Outpatient Medications on File Prior to Visit  Medication Sig Dispense Refill   Aspirin-Acetaminophen-Caffeine (GOODY HEADACHE PO) Take 1 packet by mouth daily as needed (pain).     Aspirin-Caffeine (BC FAST PAIN RELIEF  PO) Take 1 packet by mouth daily as needed (pain).     bismuth subsalicylate (PEPTO BISMOL) 262 MG/15ML suspension Take 30 mLs by mouth every 6 (six) hours as needed for indigestion or diarrhea or loose stools.     cyanocobalamin (,VITAMIN B-12,) 1000 MCG/ML injection Inject 1 ml once weekly for 3 weeks & inject 1 ml once for 5 months dx code: E53.8 10 mL 0   famotidine (PEPCID) 20 MG tablet Take 1 tablet (20 mg total) by mouth daily. (Patient taking differently: Take 20 mg by mouth daily as needed for heartburn.) 30 tablet 3   ibuprofen (ADVIL) 200 MG tablet Take 400-800 mg by mouth every 6 (six) hours as needed for moderate pain or headache.     losartan-hydrochlorothiazide (HYZAAR) 50-12.5 MG tablet Take 1 tablet by mouth  daily. 30 tablet 3   oxymetazoline (AFRIN) 0.05 % nasal spray Place 1 spray into both nostrils 2 (two) times daily as needed for congestion.     SYRINGE-NEEDLE, DISP, 3 ML (EASY TOUCH SAFETY SYRINGE) 22G X 1" 3 ML MISC Syringe-needles needed to injection vitamin b12 1 time weekly for 3 weeks and 1 time monthly for 5 months dx code: E53.8 10 each 0   No current facility-administered medications on file prior to visit.    BP 136/63   Pulse 72   Resp 18   Ht 5\' 3"  (1.6 m)   Wt 223 lb (101.2 kg)   LMP  (LMP Unknown)   SpO2 99%   BMI 39.50 kg/m       Objective:   Physical Exam General- No acute distress. Pleasant patient. Neck- Full range of motion, no jvd Lungs- Clear, even and unlabored. Heart- regular rate and rhythm. Neurologic- CNII- XII grossly intact.      Assessment:    . Patient Instructions  Your bp has been consistently well controlled since last week when you had very high reading. Good bp reading today on repeat manual testing. I would not recommend any changes.   I don't see reason to make adjustment to regimen or change surgery plans. If bp changes dramatically surgeon or anesthesiologist may make judgement call.  Follow up 3 months or sooner if needed.   Mackie Pai, PA-C     Plan:

## 2021-09-08 NOTE — Patient Instructions (Addendum)
Your bp has been consistently well controlled since last week when you had very high reading. Good bp reading today on repeat manual testing. I would not recommend any changes.   I don't see reason to make adjustment to regimen or change surgery plans. If bp changes dramatically surgeon or anesthesiologist may make judgement call.  Follow up 3 months or sooner if needed.

## 2021-09-09 ENCOUNTER — Ambulatory Visit (HOSPITAL_COMMUNITY): Payer: BC Managed Care – PPO | Admitting: Physician Assistant

## 2021-09-09 ENCOUNTER — Ambulatory Visit (HOSPITAL_COMMUNITY): Payer: BC Managed Care – PPO

## 2021-09-09 ENCOUNTER — Encounter (HOSPITAL_COMMUNITY)
Admission: RE | Disposition: A | Discharge status: Home / Self Care | Payer: Self-pay | Source: Home / Self Care | Attending: Surgery

## 2021-09-09 ENCOUNTER — Ambulatory Visit (HOSPITAL_COMMUNITY)
Admission: RE | Admit: 2021-09-09 | Discharge: 2021-09-09 | Disposition: A | Discharge status: Home / Self Care | Payer: BC Managed Care – PPO | Attending: Surgery | Admitting: Surgery

## 2021-09-09 ENCOUNTER — Ambulatory Visit (HOSPITAL_COMMUNITY): Payer: BC Managed Care – PPO | Admitting: Certified Registered"

## 2021-09-09 ENCOUNTER — Encounter (HOSPITAL_COMMUNITY): Payer: Self-pay | Admitting: Surgery

## 2021-09-09 DIAGNOSIS — F418 Other specified anxiety disorders: Secondary | ICD-10-CM | POA: Diagnosis not present

## 2021-09-09 DIAGNOSIS — Z91040 Latex allergy status: Secondary | ICD-10-CM | POA: Insufficient documentation

## 2021-09-09 DIAGNOSIS — I10 Essential (primary) hypertension: Secondary | ICD-10-CM | POA: Diagnosis not present

## 2021-09-09 DIAGNOSIS — Z8379 Family history of other diseases of the digestive system: Secondary | ICD-10-CM | POA: Insufficient documentation

## 2021-09-09 DIAGNOSIS — K8020 Calculus of gallbladder without cholecystitis without obstruction: Secondary | ICD-10-CM | POA: Diagnosis not present

## 2021-09-09 DIAGNOSIS — K8012 Calculus of gallbladder with acute and chronic cholecystitis without obstruction: Secondary | ICD-10-CM

## 2021-09-09 DIAGNOSIS — Z885 Allergy status to narcotic agent status: Secondary | ICD-10-CM | POA: Diagnosis not present

## 2021-09-09 DIAGNOSIS — K8010 Calculus of gallbladder with chronic cholecystitis without obstruction: Secondary | ICD-10-CM | POA: Insufficient documentation

## 2021-09-09 DIAGNOSIS — K819 Cholecystitis, unspecified: Secondary | ICD-10-CM | POA: Diagnosis not present

## 2021-09-09 DIAGNOSIS — E559 Vitamin D deficiency, unspecified: Secondary | ICD-10-CM | POA: Diagnosis not present

## 2021-09-09 HISTORY — PX: CHOLECYSTECTOMY: SHX55

## 2021-09-09 SURGERY — LAPAROSCOPIC CHOLECYSTECTOMY WITH INTRAOPERATIVE CHOLANGIOGRAM
Anesthesia: General | Site: Abdomen

## 2021-09-09 MED ORDER — 0.9 % SODIUM CHLORIDE (POUR BTL) OPTIME
TOPICAL | Status: DC | PRN
  Administered 2021-09-09: 1000 mL

## 2021-09-09 MED ORDER — ROCURONIUM BROMIDE 10 MG/ML (PF) SYRINGE
PREFILLED_SYRINGE | INTRAVENOUS | Status: DC | PRN
  Administered 2021-09-09 (×2): 10 mg via INTRAVENOUS
  Administered 2021-09-09: 50 mg via INTRAVENOUS

## 2021-09-09 MED ORDER — BUPIVACAINE-EPINEPHRINE (PF) 0.25% -1:200000 IJ SOLN
INTRAMUSCULAR | Status: AC
  Filled 2021-09-09: qty 30

## 2021-09-09 MED ORDER — LIDOCAINE 2% (20 MG/ML) 5 ML SYRINGE
INTRAMUSCULAR | Status: DC | PRN
  Administered 2021-09-09: 40 mg via INTRAVENOUS

## 2021-09-09 MED ORDER — ORAL CARE MOUTH RINSE: 15.0000 mL | Freq: Once | OROMUCOSAL | Status: AC

## 2021-09-09 MED ORDER — FENTANYL CITRATE (PF) 250 MCG/5ML IJ SOLN
INTRAMUSCULAR | Status: DC | PRN
  Administered 2021-09-09 (×2): 50 ug via INTRAVENOUS
  Administered 2021-09-09: 100 ug via INTRAVENOUS
  Administered 2021-09-09: 50 ug via INTRAVENOUS

## 2021-09-09 MED ORDER — FENTANYL CITRATE (PF) 100 MCG/2ML IJ SOLN
INTRAMUSCULAR | Status: AC
  Filled 2021-09-09: qty 2

## 2021-09-09 MED ORDER — FENTANYL CITRATE PF 50 MCG/ML IJ SOSY: 25.0000 ug | PREFILLED_SYRINGE | INTRAMUSCULAR | Status: DC | PRN

## 2021-09-09 MED ORDER — ALBUTEROL SULFATE (2.5 MG/3ML) 0.083% IN NEBU: 2.5000 mg | INHALATION_SOLUTION | Freq: Once | RESPIRATORY_TRACT | Status: AC

## 2021-09-09 MED ORDER — PROPOFOL 10 MG/ML IV BOLUS
INTRAVENOUS | Status: DC | PRN
  Administered 2021-09-09: 140 mg via INTRAVENOUS
  Administered 2021-09-09: 30 mg via INTRAVENOUS

## 2021-09-09 MED ORDER — ACETAMINOPHEN 160 MG/5ML PO SOLN: 325.0000 mg | ORAL | Status: DC | PRN

## 2021-09-09 MED ORDER — LACTATED RINGERS IV SOLN: INTRAVENOUS | Status: DC

## 2021-09-09 MED ORDER — CEFAZOLIN SODIUM-DEXTROSE 2-4 GM/100ML-% IV SOLN
2.0000 g | INTRAVENOUS | Status: AC
  Administered 2021-09-09: 2 g via INTRAVENOUS
  Filled 2021-09-09: qty 100

## 2021-09-09 MED ORDER — ROCURONIUM BROMIDE 10 MG/ML (PF) SYRINGE
PREFILLED_SYRINGE | INTRAVENOUS | Status: AC
  Filled 2021-09-09: qty 10

## 2021-09-09 MED ORDER — OXYCODONE HCL 5 MG PO TABS: 5.0000 mg | ORAL_TABLET | Freq: Once | ORAL | Status: AC | PRN

## 2021-09-09 MED ORDER — ONDANSETRON HCL 4 MG/2ML IJ SOLN
INTRAMUSCULAR | Status: DC | PRN
  Administered 2021-09-09: 4 mg via INTRAVENOUS

## 2021-09-09 MED ORDER — TRAMADOL HCL 50 MG PO TABS: 50.0000 mg | ORAL_TABLET | Freq: Four times a day (QID) | ORAL | 0 refills | Status: DC | PRN

## 2021-09-09 MED ORDER — OXYCODONE HCL 5 MG PO TABS
ORAL_TABLET | ORAL | Status: AC
  Administered 2021-09-09: 5 mg via ORAL
  Filled 2021-09-09: qty 1

## 2021-09-09 MED ORDER — ACETAMINOPHEN 325 MG PO TABS: 325.0000 mg | ORAL_TABLET | ORAL | Status: DC | PRN

## 2021-09-09 MED ORDER — ALBUTEROL SULFATE (2.5 MG/3ML) 0.083% IN NEBU
INHALATION_SOLUTION | RESPIRATORY_TRACT | Status: AC
  Administered 2021-09-09: 2.5 mg via RESPIRATORY_TRACT
  Filled 2021-09-09: qty 3

## 2021-09-09 MED ORDER — CHLORHEXIDINE GLUCONATE CLOTH 2 % EX PADS: 6.0000 | MEDICATED_PAD | Freq: Once | CUTANEOUS | Status: DC

## 2021-09-09 MED ORDER — CHLORHEXIDINE GLUCONATE 0.12 % MT SOLN
15.0000 mL | Freq: Once | OROMUCOSAL | Status: AC
  Administered 2021-09-09: 15 mL via OROMUCOSAL

## 2021-09-09 MED ORDER — OXYCODONE HCL 5 MG/5ML PO SOLN: 5.0000 mg | Freq: Once | ORAL | Status: AC | PRN

## 2021-09-09 MED ORDER — SUGAMMADEX SODIUM 200 MG/2ML IV SOLN
INTRAVENOUS | Status: DC | PRN
  Administered 2021-09-09: 200 mg via INTRAVENOUS

## 2021-09-09 MED ORDER — MIDAZOLAM HCL 2 MG/2ML IJ SOLN
INTRAMUSCULAR | Status: AC
  Filled 2021-09-09: qty 2

## 2021-09-09 MED ORDER — MIDAZOLAM HCL 2 MG/2ML IJ SOLN
INTRAMUSCULAR | Status: DC | PRN
  Administered 2021-09-09: 2 mg via INTRAVENOUS

## 2021-09-09 MED ORDER — BUPIVACAINE-EPINEPHRINE (PF) 0.25% -1:200000 IJ SOLN
INTRAMUSCULAR | Status: DC | PRN
  Administered 2021-09-09: 30 mL

## 2021-09-09 MED ORDER — PROPOFOL 10 MG/ML IV BOLUS
INTRAVENOUS | Status: AC
  Filled 2021-09-09: qty 40

## 2021-09-09 MED ORDER — DEXAMETHASONE SODIUM PHOSPHATE 10 MG/ML IJ SOLN
INTRAMUSCULAR | Status: DC | PRN
  Administered 2021-09-09: 4 mg via INTRAVENOUS

## 2021-09-09 MED ORDER — HEMOSTATIC AGENTS (NO CHARGE) OPTIME
TOPICAL | Status: DC | PRN
  Administered 2021-09-09: 1 via TOPICAL

## 2021-09-09 MED ORDER — SCOPOLAMINE 1 MG/3DAYS TD PT72
1.0000 | MEDICATED_PATCH | TRANSDERMAL | Status: DC
  Administered 2021-09-09: 1.5 mg via TRANSDERMAL
  Filled 2021-09-09: qty 1

## 2021-09-09 MED ORDER — DEXAMETHASONE SODIUM PHOSPHATE 10 MG/ML IJ SOLN
INTRAMUSCULAR | Status: AC
  Filled 2021-09-09: qty 1

## 2021-09-09 MED ORDER — ONDANSETRON HCL 4 MG/2ML IJ SOLN
INTRAMUSCULAR | Status: AC
  Filled 2021-09-09: qty 2

## 2021-09-09 MED ORDER — PROMETHAZINE HCL 25 MG/ML IJ SOLN: 6.2500 mg | INTRAMUSCULAR | Status: DC | PRN

## 2021-09-09 MED ORDER — AMISULPRIDE (ANTIEMETIC) 5 MG/2ML IV SOLN: 10.0000 mg | Freq: Once | INTRAVENOUS | Status: DC | PRN

## 2021-09-09 MED ORDER — ACETAMINOPHEN 10 MG/ML IV SOLN: 1000.0000 mg | Freq: Once | INTRAVENOUS | Status: DC | PRN

## 2021-09-09 MED ORDER — SODIUM CHLORIDE (PF) 0.9 % IJ SOLN
INTRAMUSCULAR | Status: DC | PRN
  Administered 2021-09-09: 13.5 mL

## 2021-09-09 MED ORDER — LACTATED RINGERS IR SOLN
Status: DC | PRN
  Administered 2021-09-09: 1000 mL

## 2021-09-09 SURGICAL SUPPLY — 41 items
ADH SKN CLS APL DERMABOND .7 (GAUZE/BANDAGES/DRESSINGS) ×1
APL PRP STRL LF DISP 70% ISPRP (MISCELLANEOUS) ×2
APPLIER CLIP ROT 10 11.4 M/L (STAPLE) ×2
APR CLP MED LRG 11.4X10 (STAPLE) ×1
BAG COUNTER SPONGE SURGICOUNT (BAG) IMPLANT
BAG SPEC RTRVL LRG 6X4 10 (ENDOMECHANICALS) ×1
BAG SPNG CNTER NS LX DISP (BAG)
CABLE HIGH FREQUENCY MONO STRZ (ELECTRODE) ×2 IMPLANT
CHLORAPREP W/TINT 26 (MISCELLANEOUS) ×4 IMPLANT
CLIP APPLIE ROT 10 11.4 M/L (STAPLE) ×1 IMPLANT
COVER MAYO STAND STRL (DRAPES) ×2 IMPLANT
COVER SURGICAL LIGHT HANDLE (MISCELLANEOUS) ×2 IMPLANT
DECANTER SPIKE VIAL GLASS SM (MISCELLANEOUS) ×2 IMPLANT
DERMABOND ADVANCED (GAUZE/BANDAGES/DRESSINGS) ×1
DERMABOND ADVANCED .7 DNX12 (GAUZE/BANDAGES/DRESSINGS) IMPLANT
DRAPE C-ARM 42X120 X-RAY (DRAPES) ×2 IMPLANT
ELECT REM PT RETURN 15FT ADLT (MISCELLANEOUS) ×2 IMPLANT
GAUZE SPONGE 2X2 8PLY STRL LF (GAUZE/BANDAGES/DRESSINGS) ×1 IMPLANT
GLOVE SURG SYN 7.5  E (GLOVE) ×4
GLOVE SURG SYN 7.5 E (GLOVE) ×2 IMPLANT
GLOVE SURG SYN 7.5 PF PI (GLOVE) ×2 IMPLANT
GOWN STRL REUS W/TWL XL LVL3 (GOWN DISPOSABLE) ×4 IMPLANT
HEMOSTAT SURGICEL 4X8 (HEMOSTASIS) IMPLANT
KIT BASIN OR (CUSTOM PROCEDURE TRAY) ×2 IMPLANT
KIT TURNOVER KIT A (KITS) ×2 IMPLANT
PENCIL SMOKE EVACUATOR (MISCELLANEOUS) IMPLANT
POUCH SPECIMEN RETRIEVAL 10MM (ENDOMECHANICALS) ×2 IMPLANT
SCISSORS LAP 5X35 DISP (ENDOMECHANICALS) ×2 IMPLANT
SET CHOLANGIOGRAPH MIX (MISCELLANEOUS) ×2 IMPLANT
SET IRRIG TUBING LAPAROSCOPIC (IRRIGATION / IRRIGATOR) ×2 IMPLANT
SET TUBE SMOKE EVAC HIGH FLOW (TUBING) IMPLANT
SLEEVE XCEL OPT CAN 5 100 (ENDOMECHANICALS) ×2 IMPLANT
SPONGE GAUZE 2X2 STER 10/PKG (GAUZE/BANDAGES/DRESSINGS) ×1
STRIP CLOSURE SKIN 1/2X4 (GAUZE/BANDAGES/DRESSINGS) IMPLANT
SUT MNCRL AB 4-0 PS2 18 (SUTURE) ×2 IMPLANT
TOWEL OR 17X26 10 PK STRL BLUE (TOWEL DISPOSABLE) ×2 IMPLANT
TOWEL OR NON WOVEN STRL DISP B (DISPOSABLE) ×2 IMPLANT
TRAY LAPAROSCOPIC (CUSTOM PROCEDURE TRAY) ×2 IMPLANT
TROCAR BLADELESS OPT 5 100 (ENDOMECHANICALS) ×2 IMPLANT
TROCAR XCEL BLUNT TIP 100MML (ENDOMECHANICALS) ×2 IMPLANT
TROCAR XCEL NON-BLD 11X100MML (ENDOMECHANICALS) ×2 IMPLANT

## 2021-09-09 NOTE — Interval H&P Note (Signed)
History and Physical Interval Note:  09/09/2021 7:18 AM  Whitney Gray  has presented today for surgery, with the diagnosis of CHRONIC CHOLECYSTITIS, CHOLELITHIASIS.  The various methods of treatment have been discussed with the patient and family. After consideration of risks, benefits and other options for treatment, the patient has consented to    Procedure(s): LAPAROSCOPIC CHOLECYSTECTOMY WITH INTRAOPERATIVE CHOLANGIOGRAM (N/A) as a surgical intervention.    The patient's history has been reviewed, patient examined, no change in status, stable for surgery.  I have reviewed the patient's chart and labs.  Questions were answered to the patient's satisfaction.    Armandina Gemma, [facility] [place] practice Office: [place]

## 2021-09-09 NOTE — Anesthesia Preprocedure Evaluation (Signed)
Anesthesia Evaluation  Patient identified by MRN, date of birth, ID band Patient awake    Reviewed: Allergy & Precautions, NPO status , Patient's Chart, lab work & pertinent test results  Airway Mallampati: II  TM Distance: >3 FB Neck ROM: Full    Dental  (+) Teeth Intact, Dental Advisory Given   Pulmonary asthma ,    breath sounds clear to auscultation       Cardiovascular hypertension, Pt. on medications  Rhythm:Regular Rate:Normal     Neuro/Psych PSYCHIATRIC DISORDERS Anxiety Depression negative neurological ROS     GI/Hepatic Neg liver ROS, GERD  Medicated,  Endo/Other  negative endocrine ROS  Renal/GU negative Renal ROS     Musculoskeletal  (+) Arthritis ,   Abdominal Normal abdominal exam  (+)   Peds  Hematology negative hematology ROS (+)   Anesthesia Other Findings   Reproductive/Obstetrics                             Anesthesia Physical Anesthesia Plan  ASA: 3  Anesthesia Plan: General   Post-op Pain Management:    Induction: Intravenous  PONV Risk Score and Plan: 4 or greater and Ondansetron, Dexamethasone, Midazolam and Scopolamine patch - Pre-op  Airway Management Planned: Oral ETT  Additional Equipment: None  Intra-op Plan:   Post-operative Plan: Extubation in OR  Informed Consent: I have reviewed the patients History and Physical, chart, labs and discussed the procedure including the risks, benefits and alternatives for the proposed anesthesia with the patient or authorized representative who has indicated his/her understanding and acceptance.     Dental advisory given  Plan Discussed with: CRNA  Anesthesia Plan Comments:         Anesthesia Quick Evaluation

## 2021-09-09 NOTE — Anesthesia Procedure Notes (Signed)
Procedure Name: Intubation Date/Time: 09/09/2021 7:37 AM Performed by: Eben Burow, CRNA Pre-anesthesia Checklist: Patient identified, Emergency Drugs available, Suction available, Patient being monitored and Timeout performed Patient Re-evaluated:Patient Re-evaluated prior to induction Oxygen Delivery Method: Circle system utilized Preoxygenation: Pre-oxygenation with 100% oxygen Induction Type: IV induction Ventilation: Mask ventilation without difficulty Laryngoscope Size: Mac and 4 Grade View: Grade I Tube type: Oral Tube size: 7.0 mm Number of attempts: 1 Airway Equipment and Method: Stylet Placement Confirmation: ETT inserted through vocal cords under direct vision, positive ETCO2 and breath sounds checked- equal and bilateral Secured at: 21 cm Tube secured with: Tape Dental Injury: Teeth and Oropharynx as per pre-operative assessment

## 2021-09-09 NOTE — Discharge Instructions (Addendum)
[facility]  LAPAROSCOPIC SURGERY:  POST-OP INSTRUCTIONS  Always review your discharge instruction sheet given to you by the facility where your surgery was performed.  A prescription for pain medication may be given to you upon discharge.  Take your pain medication as prescribed.  If narcotic pain medicine is not needed, then you may take acetaminophen (Tylenol) or ibuprofen (Advil) as needed.  Take your usually prescribed medications unless otherwise directed.  If you need a refill on your pain medication, please contact your pharmacy.  They will contact our office to request authorization. Prescriptions will not be filled after 5 P.M. or on weekends.  You should follow a light diet the first few days after arrival home, such as soup and crackers or toast.  Be sure to include plenty of fluids daily.  Most patients will experience some swelling and bruising in the area of the incisions.  Ice packs will help.  Swelling and bruising can take several days to resolve.   It is common to experience some constipation after surgery.  Increasing fluid intake and taking a stool softener (such as Colace) will usually help or prevent this problem from occurring.  A mild laxative (Milk of Magnesia or Miralax) should be taken according to package instructions if there has been no bowel movement after 48 hours.  You will likely have Dermabond (topical glue) over your incisions.  This seals the incisions and allows you to bathe and shower at any time after your surgery.  Glue should remain in place for up to 10 days.  It may be removed after 10 days by pealing off the Dermabond material or using Vaseline or naval jelly to remove.  If you have steri-strips over your incisions, you may remove the gauze bandage on the second day after surgery, and you may shower at that time.  Leave your steri-strips (small skin tapes) in place directly over the incision.  These strips should remain on the  skin for 5-7 days and then be removed.  You may get them wet in the shower and pat them dry.  Any sutures or staples will be removed at the office during your follow-up visit.  ACTIVITIES:  You may resume regular (light) daily activities beginning the next day - such as daily self-care, walking, climbing stairs - gradually increasing activities as tolerated.  You may have sexual intercourse when it is comfortable.  Refrain from any heavy lifting or straining until approved by your doctor.  You may drive when you are no longer taking prescription pain medication, when you can comfortably wear a seatbelt, and when you can safely maneuver your car and apply brakes.  You should see your doctor in the office for a follow-up appointment approximately 2-3 weeks after your surgery.  Make sure that you call for this appointment within a day or two after you arrive home to insure a convenient appointment time.  WHEN TO CALL YOUR DOCTOR: Fever over 101.0 Inability to urinate Continued bleeding from incision Increased pain, redness, or drainage from the incision Increasing abdominal pain  The clinic staff is available to answer your questions during regular business hours.  Please don't hesitate to call and ask to speak to one of the nurses for clinical concerns.  If you have a medical emergency, go to the nearest emergency room or call 911.  A surgeon from [facility] is always on call for the hospital.  Todd Gerkin, MD [facility] Office: [phone] Toll Free:    [phone] FAX [phone]  Website: [email or web address] 

## 2021-09-09 NOTE — Op Note (Signed)
Procedure Note  Pre-operative Diagnosis:  chronic cholecystitis, cholelithiasis  Post-operative Diagnosis:  same  Surgeon:  Armandina Gemma, MD  Assistant:  Carlena Hurl, PA-C   Procedure:  Laparoscopic cholecystectomy with intra-operative cholangiography  Anesthesia:  General  Estimated Blood Loss:  50 cc  Drains: none         Specimen: gallbladder to pathology  Indications:  Patient is referred by Mackie Pai, PA-C, for surgical evaluation and management of symptomatic cholelithiasis and chronic cholecystitis. Patient presents with a 1 year history of intermittent epigastric and right upper quadrant abdominal pain. This is associated with nausea and vomiting. It can last for several hours. She has been seen at the urgent care on 3 separate occasions. Patient had an ultrasound performed on July 15, 2021. This showed multiple gallstones with the largest measuring 1.7 cm in size. There was no sign of acute inflammation. Previous abdominal surgery includes cesarean section. Patient denies any history of jaundice or acholic stools. She has had fever on at least 1 occasion. There is a family history of gallbladder disease in the patient's mother and her son. She presents today to discuss cholecystectomy for symptomatic cholelithiasis and chronic cholecystitis. 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  Procedure Details:  The patient was seen in the pre-op holding area. The risks, benefits, complications, treatment options, and expected outcomes were previously discussed with the patient. The patient agreed with the proposed plan and has signed the informed consent form.  The patient was transported to operating room #4 at the [facility]. The patient was placed in the supine position on the operating room table. Following induction of general anesthesia, the abdomen was prepped and draped in the usual aseptic fashion.  An incision was made in the skin near the umbilicus. The midline  fascia was incised and the peritoneal cavity was entered and a Hasson cannula was introduced under direct vision. The cannula was secured with a 0-Vicryl pursestring suture. Pneumoperitoneum was established with carbon dioxide. Additional cannulae were introduced under direct vision along the right costal margin in the midline, mid-clavicular line, and anterior axillary line.   The gallbladder was identified and the fundus grasped and retracted cephalad. Adhesions were taken down bluntly and the electrocautery was utilized as needed, taking care not to involve any adjacent structures. The infundibulum was grasped and retracted laterally, exposing the peritoneum overlying the triangle of Calot. The peritoneum was incised and structures exposed with blunt dissection. The cystic duct was clearly identified, bluntly dissected circumferentially, and clipped at the neck of the gallbladder.  An incision was made in the cystic duct and the cholangiogram catheter introduced. The catheter was secured using an ligaclip.  Real-time cholangiography was performed using C-arm fluoroscopy.  There was rapid filling of a normal caliber common bile duct.  There was reflux of contrast into the left and right hepatic ductal systems.  There was free flow distally into the duodenum without filling defect or obstruction.  The catheter was removed from the peritoneal cavity.  The cystic duct was then ligated with ligaclips and divided. The cystic artery was identified, dissected circumferentially, ligated with ligaclips, and divided.  The gallbladder was dissected away from the gallbladder bed using the electrocautery for hemostasis. In the gallbladder bed was a venous lake which required cauterization and then application of snow with good control.  The gallbladder was completely removed from the liver and placed into an endocatch bag. The gallbladder was removed in the endocatch bag through the umbilical port site  and submitted to  pathology for review.  The right upper quadrant was irrigated and the gallbladder bed was inspected. Hemostasis was achieved with the electrocautery.  Cannulae were removed under direct vision and good hemostasis was noted. Pneumoperitoneum was released and the majority of the carbon dioxide evacuated. The umbilical wound was irrigated and the fascia was then closed with the pursestring suture.  Local anesthetic was infiltrated at all port sites. Skin incisions were closed with 4-0 Monocril subcuticular sutures and Dermabond was applied.  Instrument, sponge, and needle counts were correct at the conclusion of the case.  The patient was awakened from anesthesia and brought to the recovery room in stable condition.  The patient tolerated the procedure well.   Armandina Gemma, MD [facility] Office: [phone]

## 2021-09-09 NOTE — Transfer of Care (Signed)
Immediate Anesthesia Transfer of Care Note  Patient: Whitney Gray  Procedure(s) Performed: LAPAROSCOPIC CHOLECYSTECTOMY WITH INTRAOPERATIVE CHOLANGIOGRAM (Abdomen)  Patient Location: PACU  Anesthesia Type:General  Level of Consciousness: drowsy and patient cooperative  Airway & Oxygen Therapy: Patient Spontanous Breathing and Patient connected to face mask oxygen  Post-op Assessment: Report given to RN and Post -op Vital signs reviewed and stable  Post vital signs: Reviewed and stable  Last Vitals:  Vitals Value Taken Time  BP 162/101 09/09/21 0911  Temp    Pulse 78 09/09/21 0913  Resp 18 09/09/21 0913  SpO2 100 % 09/09/21 0913  Vitals shown include unvalidated device data.  Last Pain:  Vitals:   09/09/21 0614  TempSrc:   PainSc: 0-No pain         Complications: No notable events documented.

## 2021-09-10 ENCOUNTER — Encounter (HOSPITAL_COMMUNITY): Payer: Self-pay | Admitting: Surgery

## 2021-09-10 LAB — SURGICAL PATHOLOGY

## 2021-09-10 NOTE — Anesthesia Postprocedure Evaluation (Signed)
Anesthesia Post Note  Patient: [name]  Procedure(s) Performed: LAPAROSCOPIC CHOLECYSTECTOMY WITH INTRAOPERATIVE CHOLANGIOGRAM (Abdomen)     Patient location during evaluation: PACU Anesthesia Type: General Level of consciousness: awake and alert Pain management: pain level controlled Vital Signs Assessment: post-procedure vital signs reviewed and stable Respiratory status: spontaneous breathing, nonlabored ventilation, respiratory function stable and patient connected to nasal cannula oxygen Cardiovascular status: blood pressure returned to baseline and stable Postop Assessment: no apparent nausea or vomiting Anesthetic complications: no   No notable events documented.          Whitney Gray

## 2021-09-21 ENCOUNTER — Telehealth: Payer: Self-pay | Admitting: Medical

## 2021-09-21 DIAGNOSIS — Z1211 Encounter for screening for malignant neoplasm of colon: Secondary | ICD-10-CM

## 2021-09-21 NOTE — Telephone Encounter (Signed)
Is it ok for pt to wait until 2023

## 2021-09-21 NOTE — Telephone Encounter (Signed)
Chart opened to rx med, order lab, review chart, respond to my chart message or send message to staff member  

## 2021-09-21 NOTE — Telephone Encounter (Signed)
Patient states she got a call telling her that she needs to schedule a colonoscopy. She would like to know why she needs one and what is it that a colonoscopy entails. If she does need one, she would like to wait until 2023 to get one. Please advice.

## 2021-09-22 NOTE — Telephone Encounter (Signed)
Called pt and lvm to return call.  

## 2021-09-23 NOTE — Telephone Encounter (Signed)
Pt called and unable to lvm due to full mailbox

## 2021-11-03 ENCOUNTER — Ambulatory Visit (HOSPITAL_BASED_OUTPATIENT_CLINIC_OR_DEPARTMENT_OTHER): Payer: BC Managed Care – PPO

## 2021-11-23 ENCOUNTER — Telehealth (HOSPITAL_BASED_OUTPATIENT_CLINIC_OR_DEPARTMENT_OTHER): Payer: Self-pay

## 2021-12-11 ENCOUNTER — Telehealth (HOSPITAL_BASED_OUTPATIENT_CLINIC_OR_DEPARTMENT_OTHER): Payer: Self-pay

## 2021-12-17 ENCOUNTER — Ambulatory Visit: Payer: BC Managed Care – PPO

## 2021-12-18 ENCOUNTER — Ambulatory Visit: Payer: BC Managed Care – PPO

## 2022-03-07 ENCOUNTER — Other Ambulatory Visit: Payer: Self-pay

## 2022-03-07 ENCOUNTER — Encounter (HOSPITAL_COMMUNITY): Payer: Self-pay | Admitting: *Deleted

## 2022-03-07 ENCOUNTER — Ambulatory Visit (HOSPITAL_COMMUNITY)
Admission: EM | Admit: 2022-03-07 | Discharge: 2022-03-07 | Disposition: A | Discharge status: Home / Self Care | Payer: BC Managed Care – PPO | Attending: Family Medicine | Admitting: Family Medicine

## 2022-03-07 DIAGNOSIS — I10 Essential (primary) hypertension: Secondary | ICD-10-CM | POA: Diagnosis not present

## 2022-03-07 DIAGNOSIS — Z20822 Contact with and (suspected) exposure to covid-19: Secondary | ICD-10-CM | POA: Insufficient documentation

## 2022-03-07 DIAGNOSIS — R059 Cough, unspecified: Secondary | ICD-10-CM | POA: Insufficient documentation

## 2022-03-07 DIAGNOSIS — J069 Acute upper respiratory infection, unspecified: Secondary | ICD-10-CM | POA: Insufficient documentation

## 2022-03-07 MED ORDER — BENZONATATE 100 MG PO CAPS: 100.0000 mg | ORAL_CAPSULE | Freq: Three times a day (TID) | ORAL | 0 refills | Status: DC | PRN

## 2022-03-07 MED ORDER — ONDANSETRON 4 MG PO TBDP: 4.0000 mg | ORAL_TABLET | Freq: Three times a day (TID) | ORAL | 0 refills | Status: DC | PRN

## 2022-03-07 NOTE — ED Provider Notes (Signed)
?[facility] ? ? ? ?CSN: [id number] ?Arrival date & time: 03/07/22  1644 ? ? ?  ? ?History   ?Chief Complaint ?Chief Complaint  ?Patient presents with  ? Cough  ? ? ?HPI ?Whitney Eppes is a 51 y.o. female.  ? ? ?Cough ?Here for congestion and cough that began overnight.  She has had some nausea though no vomiting.  Also she feels tired and is having some aching.  Her husband was very ill last week, but he would not get tested for anything. ? ?Past Medical History:  ?Diagnosis Date  ? Amenorrhea   ? Anemia   ? Arthritis   ? Bilateral knees  ? Asthma   ? Depression with anxiety 03/25/2020  ? Heart murmur   ? Hypertension   ? Pituitary adenoma ([place])   ? Prolactinoma ([place])   ? ? ?Patient Active Problem List  ? Diagnosis Date Noted  ? Cholelithiasis with chronic cholecystitis 08/05/2021  ? Varicose veins of both lower extremities with pain 11/20/2020  ? Encounter for screening mammogram for malignant neoplasm of breast 09/20/2017  ? Hypertension 04/05/2017  ? Dandruff 07/10/2015  ? Seasonal allergies 07/10/2015  ? Pituitary adenoma ([place]) 07/10/2015  ? Obesity 07/10/2015  ? Allergic rhinitis 03/13/2015  ? Sickle cell trait ([place]) 02/14/2015  ? Vitamin D insufficiency 02/14/2015  ? ? ?Past Surgical History:  ?Procedure Laterality Date  ? CESAREAN SECTION  2004  ? CHOLECYSTECTOMY N/A 09/09/2021  ? Procedure: LAPAROSCOPIC CHOLECYSTECTOMY WITH INTRAOPERATIVE CHOLANGIOGRAM;  Surgeon: Armandina Gemma, MD;  Location: [facility];  Service: General;  Laterality: N/A;  ? TUBAL LIGATION    ? ? ?OB History   ?No obstetric history on file. ?  ? ? ? ?Home Medications   ? ?Prior to Admission medications   ?Medication Sig Start Date End Date Taking? Authorizing Provider  ?benzonatate (TESSALON) 100 MG capsule Take 1 capsule (100 mg total) by mouth 3 (three) times daily as needed for cough. 03/07/22  Yes Barrett Henle, MD  ?ondansetron (ZOFRAN-ODT) 4 MG disintegrating tablet Take 1 tablet (4 mg total) by mouth every 8 (eight) hours  as needed for nausea or vomiting. 03/07/22  Yes Barrett Henle, MD  ?Aspirin-Acetaminophen-Caffeine (GOODY HEADACHE PO) Take 1 packet by mouth daily as needed (pain).    [provider]  ?Aspirin-Caffeine (BC FAST PAIN RELIEF PO) Take 1 packet by mouth daily as needed (pain).    [provider]  ?bismuth subsalicylate (PEPTO BISMOL) 262 MG/15ML suspension Take 30 mLs by mouth every 6 (six) hours as needed for indigestion or diarrhea or loose stools.    [provider]  ?cyanocobalamin (,VITAMIN B-12,) 1000 MCG/ML injection Inject 1 ml once weekly for 3 weeks & inject 1 ml once for 5 months dx code: E53.8 07/24/21   Saguier, Percell Miller, PA-C  ?famotidine (PEPCID) 20 MG tablet Take 1 tablet (20 mg total) by mouth daily. ?Patient taking differently: Take 20 mg by mouth daily as needed for heartburn. 07/15/21   Saguier, Percell Miller, PA-C  ?ibuprofen (ADVIL) 200 MG tablet Take 400-800 mg by mouth every 6 (six) hours as needed for moderate pain or headache.    [provider]  ?losartan-hydrochlorothiazide (HYZAAR) 50-12.5 MG tablet Take 1 tablet by mouth daily. 07/24/21   Saguier, Percell Miller, PA-C  ?oxymetazoline (AFRIN) 0.05 % nasal spray Place 1 spray into both nostrils 2 (two) times daily as needed for congestion.    [provider]  ?SYRINGE-NEEDLE, DISP, 3 ML (EASY TOUCH SAFETY SYRINGE) 22G  X 1" 3 ML MISC Syringe-needles needed to injection vitamin b12 1 time weekly for 3 weeks and 1 time monthly for 5 months dx code: E53.8 07/24/21   Saguier, Percell Miller, PA-C  ?traMADol (ULTRAM) 50 MG tablet Take 1-2 tablets (50-100 mg total) by mouth every 6 (six) hours as needed for moderate pain. 09/09/21   Armandina Gemma, MD  ? ? ?Family History ?Family History  ?Problem Relation Age of Onset  ? Asthma Mother   ? Alcohol abuse Father   ? Arthritis Father   ? Heart disease Father   ? Prostate cancer Maternal Grandfather   ? Diabetes Maternal Grandfather   ? ? ?Social History ?Social History  ? ?Tobacco Use   ? Smoking status: Never  ? Smokeless tobacco: Never  ?Vaping Use  ? Vaping Use: Never used  ?Substance Use Topics  ? Alcohol use: No  ?  Alcohol/week: 0.0 standard drinks  ? Drug use: No  ? ? ? ?Allergies   ?Latex and Codeine ? ? ?Review of Systems ?Review of Systems  ?Respiratory:  Positive for cough.   ? ? ?Physical Exam ?Triage Vital Signs ?ED Triage Vitals  ?Enc Vitals Group  ?   BP 03/07/22 1807 (!) 156/87  ?   Pulse Rate 03/07/22 1807 70  ?   Resp 03/07/22 1807 18  ?   Temp 03/07/22 1807 98.1 ?F (36.7 ?C)  ?   Temp src --   ?   SpO2 03/07/22 1807 100 %  ?   Weight --   ?   Height --   ?   Head Circumference --   ?   Peak Flow --   ?   Pain Score 03/07/22 1806 0  ?   Pain Loc --   ?   Pain Edu? --   ?   Excl. in [place]? --   ? ?No data found. ? ?Updated Vital Signs ?BP (!) 156/87   Pulse 70   Temp 98.1 ?F (36.7 ?C)   Resp 18   LMP  (LMP Unknown)   SpO2 100%  ? ?Visual Acuity ?Right Eye Distance:   ?Left Eye Distance:   ?Bilateral Distance:   ? ?Right Eye Near:   ?Left Eye Near:    ?Bilateral Near:    ? ?Physical Exam ?Vitals reviewed.  ?Constitutional:   ?   General: She is not in acute distress. ?   Appearance: She is not toxic-appearing.  ?HENT:  ?   Right Ear: Tympanic membrane and ear canal normal.  ?   Left Ear: Tympanic membrane and ear canal normal.  ?   Nose: Nose normal.  ?   Mouth/Throat:  ?   Mouth: Mucous membranes are moist.  ?   Pharynx: No oropharyngeal exudate or posterior oropharyngeal erythema.  ?Eyes:  ?   Extraocular Movements: Extraocular movements intact.  ?   Conjunctiva/sclera: Conjunctivae normal.  ?   Pupils: Pupils are equal, round, and reactive to light.  ?Cardiovascular:  ?   Rate and Rhythm: Normal rate and regular rhythm.  ?   Heart sounds: No murmur heard. ?Pulmonary:  ?   Effort: Pulmonary effort is normal. No respiratory distress.  ?   Breath sounds: No wheezing, rhonchi or rales.  ?Musculoskeletal:  ?   Cervical back: Neck supple.  ?Lymphadenopathy:  ?   Cervical: No  cervical adenopathy.  ?Skin: ?   Capillary Refill: Capillary refill takes less than 2 seconds.  ?   Coloration: Skin is not jaundiced  or pale.  ?Neurological:  ?   General: No focal deficit present.  ?   Mental Status: She is alert and oriented to person, place, and time.  ?Psychiatric:     ?   Behavior: Behavior normal.  ? ? ? ?UC Treatments / Results  ?Labs ?(all labs ordered are listed, but only abnormal results are displayed) ?Labs Reviewed  ?SARS CORONAVIRUS 2 (TAT 6-24 HRS)  ? ? ?EKG ? ? ?Radiology ?No results found. ? ?Procedures ?Procedures (including critical care time) ? ?Medications Ordered in UC ?Medications - No data to display ? ?Initial Impression / Assessment and Plan / UC Course  ?I have reviewed the triage vital signs and the nursing notes. ? ?Pertinent labs & imaging results that were available during my care of the patient were reviewed by me and considered in my medical decision making (see chart for details). ? ?  ? ?I will send in Zofran and Tessalon Perles for her symptoms.  Swab done for COVID, he is at high risk for severe COVID with her hypertension.  Renal function is normal in epic.  If positive, she should have a prescription for molnupiravir ?Final Clinical Impressions(s) / UC Diagnoses  ? ?Final diagnoses:  ?Viral URI with cough  ? ? ? ?Discharge Instructions   ? ?  ? ?You have been swabbed for COVID, and the test will result in the next 24 hours. Our staff will call you if positive. If the test is positive, you should quarantine for 5 days.  ? ?Take benzonatate 100 mg--1 3 times daily as needed for cough ? ?Take ondansetron dissolved in your mouth 1 every 8 hours as needed for nausea or vomiting ? ? ? ? ? ? ?ED Prescriptions   ? ? Medication Sig Dispense Auth. Provider  ? benzonatate (TESSALON) 100 MG capsule Take 1 capsule (100 mg total) by mouth 3 (three) times daily as needed for cough. 21 capsule Barrett Henle, MD  ? ondansetron (ZOFRAN-ODT) 4 MG disintegrating tablet Take 1  tablet (4 mg total) by mouth every 8 (eight) hours as needed for nausea or vomiting. 10 tablet Barrett Henle, MD  ? ?  ? ?PDMP not reviewed this encounter. ?  ?Barrett Henle, MD ?03/07/22 1832 ? ?

## 2022-03-07 NOTE — Discharge Instructions (Addendum)
?  You have been swabbed for COVID, and the test will result in the next 24 hours. Our staff will call you if positive. If the test is positive, you should quarantine for 5 days.  ? ?Take benzonatate 100 mg--1 3 times daily as needed for cough ? ?Take ondansetron dissolved in your mouth 1 every 8 hours as needed for nausea or vomiting ? ? ?

## 2022-03-07 NOTE — ED Triage Notes (Signed)
Pt reports she woke up with a cough yesterday. ?

## 2022-03-09 LAB — SARS CORONAVIRUS 2 (TAT 6-24 HRS): SARS Coronavirus 2: NEGATIVE

## 2022-03-10 ENCOUNTER — Telehealth (INDEPENDENT_AMBULATORY_CARE_PROVIDER_SITE_OTHER): Payer: BC Managed Care – PPO | Admitting: Medical

## 2022-03-10 DIAGNOSIS — J40 Bronchitis, not specified as acute or chronic: Secondary | ICD-10-CM

## 2022-03-10 DIAGNOSIS — R062 Wheezing: Secondary | ICD-10-CM

## 2022-03-10 DIAGNOSIS — J0100 Acute maxillary sinusitis, unspecified: Secondary | ICD-10-CM

## 2022-03-10 MED ORDER — ALBUTEROL SULFATE HFA 108 (90 BASE) MCG/ACT IN AERS: 2.0000 | INHALATION_SPRAY | Freq: Four times a day (QID) | RESPIRATORY_TRACT | 0 refills | Status: AC | PRN

## 2022-03-10 MED ORDER — AZITHROMYCIN 250 MG PO TABS: ORAL_TABLET | ORAL | 0 refills | Status: AC

## 2022-03-10 MED ORDER — FLUTICASONE PROPIONATE 50 MCG/ACT NA SUSP: 2.0000 | Freq: Every day | NASAL | 1 refills | Status: DC

## 2022-03-10 MED ORDER — HYDROCODONE BIT-HOMATROP MBR 5-1.5 MG/5ML PO SOLN: 5.0000 mL | Freq: Three times a day (TID) | ORAL | 0 refills | Status: DC | PRN

## 2022-03-10 NOTE — Patient Instructions (Signed)
Sinus infection and bronchitis presently with some wheezing.  Diagnosed with upper respiratory infection earlier this week in ED.  However symptoms are worsening.  COVID test negative on day of ED visit.  Though that was on first day of symptoms.  Recommend repeating COVID test today or tomorrow.  If positive let me know as in that event would prescribe antiviral.  Discussed can give antiviral within 5 days of symptom onset.  Patient expressed understanding. ? ?Prescribing azithromycin antibiotic, Hycodan cough syrup and Flonase nasal spray.  Albuterol inhaler made available for wheezing.  Rx advisement given regarding sedation side effects with the Hycodan. ? ?If signs symptoms persist or worsening advised in office visit. ? ?Follow-up in 7 to 10 days or sooner if needed. ?

## 2022-03-10 NOTE — Progress Notes (Signed)
? ?Subjective:  ? ? Patient ID: Whitney Gray, female    DOB: [birth year], 51 y.o.   MRN: [id number] ? ?HPI ? ?Virtual Visit via Video Note ? ?I connected with Whitney Gray on 03/10/22 at  9:40 AM EDT by a video enabled telemedicine application and verified that I am speaking with the correct person using two identifiers. ? ?Location: ?Patient: home ?Provider: office ?  ?I discussed the limitations of evaluation and management by telemedicine and the availability of in person appointments. The patient expressed understanding and agreed to proceed. ? ?History of Present Illness: ? ?Pt has nasal congestion, sinus pressure and now chest congestion. Symptoms for 3 days. Coughing up some colored mucus.  ? ?This time of year pt does get allergic rhinitis. ? ?Pt tested for covid and negative. First day of symptoms. ? ?Some wheezing. ? ? ?Evaluated in ED earlier this week.  ? ?  ?Observations/Objective: ?General-no acute distress, pleasant, oriented. ?Lungs- on inspection lungs appear unlabored. ?Neck- no tracheal deviation or jvd on inspection. ?Neuro- gross motor function appears intact.  ?Heent- maxillary sinus pressure. ? ?Assessment and Plan: ? ?Patient Instructions  ?Sinus infection and bronchitis presently with some wheezing.  Diagnosed with upper respiratory infection earlier this week in ED.  However symptoms are worsening.  COVID test negative on day of ED visit.  Though that was on first day of symptoms.  Recommend repeating COVID test today or tomorrow.  If positive let me know as in that event would prescribe antiviral.  Discussed can give antiviral within 5 days of symptom onset.  Patient expressed understanding. ? ?Prescribing azithromycin antibiotic, Hycodan cough syrup and Flonase nasal spray.  Albuterol inhaler made available for wheezing.  Rx advisement given regarding sedation side effects with the Hycodan. ? ?If signs symptoms persist or worsening advised in office visit. ? ?Follow-up in 7 to 10 days  or sooner if needed.  ? ? ?Mackie Pai, PA-C  ? ?Time spent with patient today was 23  minutes which consisted of chart revdiew, discussing diagnosis, work up treatment and documentation.  ? ? ?Follow Up Instructions: ? ?  ?I discussed the assessment and treatment plan with the patient. The patient was provided an opportunity to ask questions and all were answered. The patient agreed with the plan and demonstrated an understanding of the instructions. ?  ?The patient was advised to call back or seek an in-person evaluation if the symptoms worsen or if the condition fails to improve as anticipated. ? ? ? ? ?Mackie Pai, PA-C  ? ?Review of Systems  ?Constitutional:  Negative for chills, fatigue and fever.  ?HENT:  Positive for congestion, sinus pressure and sinus pain.   ?Respiratory:  Positive for cough and wheezing. Negative for chest tightness and shortness of breath.   ?Cardiovascular:  Negative for chest pain and palpitations.  ?Gastrointestinal:  Negative for abdominal pain, blood in stool and constipation.  ?Endocrine: Negative for polydipsia, polyphagia and polyuria.  ?Genitourinary:  Negative for dyspareunia and dysuria.  ?Musculoskeletal:  Negative for back pain.  ?Neurological:  Negative for dizziness, facial asymmetry and headaches.  ?Hematological:  Negative for adenopathy.  ?Psychiatric/Behavioral:  Negative for agitation and behavioral problems.   ? ? ?Past Medical History:  ?Diagnosis Date  ? Amenorrhea   ? Anemia   ? Arthritis   ? Bilateral knees  ? Asthma   ? Depression with anxiety 03/25/2020  ? Heart murmur   ? Hypertension   ? Pituitary adenoma ([place])   ? Prolactinoma ([place])   ? ?  ?  Social History  ? ?Socioeconomic History  ? Marital status: Married  ?  Spouse name: Not on file  ? Number of children: 2  ? Years of education: 55  ? Highest education level: Not on file  ?Occupational History  ? Occupation: [employer]  ?Tobacco Use  ? Smoking status: Never  ? Smokeless tobacco: Never  ?Vaping Use   ? Vaping Use: Never used  ?Substance and Sexual Activity  ? Alcohol use: No  ?  Alcohol/week: 0.0 standard drinks  ? Drug use: No  ? Sexual activity: Not Currently  ?  Partners: Male  ?  Birth control/protection: None  ?Other Topics Concern  ? Not on file  ?Social History Narrative  ? Fun: Play games  ? Denies abuse and feels safe at home.  ? Denies religious beliefs effecting health care.   ? ?Social Determinants of Health  ? ?Financial Resource Strain: Not on file  ?Food Insecurity: Not on file  ?Transportation Needs: Not on file  ?Physical Activity: Not on file  ?Stress: Not on file  ?Social Connections: Not on file  ?Intimate Partner Violence: Not on file  ? ? ?Past Surgical History:  ?Procedure Laterality Date  ? CESAREAN SECTION  2004  ? CHOLECYSTECTOMY N/A 09/09/2021  ? Procedure: LAPAROSCOPIC CHOLECYSTECTOMY WITH INTRAOPERATIVE CHOLANGIOGRAM;  Surgeon: Armandina Gemma, MD;  Location: [facility];  Service: General;  Laterality: N/A;  ? TUBAL LIGATION    ? ? ?Family History  ?Problem Relation Age of Onset  ? Asthma Mother   ? Alcohol abuse Father   ? Arthritis Father   ? Heart disease Father   ? Prostate cancer Maternal Grandfather   ? Diabetes Maternal Grandfather   ? ? ?Allergies  ?Allergen Reactions  ? Latex Rash  ? Codeine Nausea Only  ?  Per pt,nausea,vomiting,dizzy.   ? ? ?Current Outpatient Medications on File Prior to Visit  ?Medication Sig Dispense Refill  ? Aspirin-Acetaminophen-Caffeine (GOODY HEADACHE PO) Take 1 packet by mouth daily as needed (pain).    ? Aspirin-Caffeine (BC FAST PAIN RELIEF PO) Take 1 packet by mouth daily as needed (pain).    ? benzonatate (TESSALON) 100 MG capsule Take 1 capsule (100 mg total) by mouth 3 (three) times daily as needed for cough. 21 capsule 0  ? bismuth subsalicylate (PEPTO BISMOL) 262 MG/15ML suspension Take 30 mLs by mouth every 6 (six) hours as needed for indigestion or diarrhea or loose stools.    ? cyanocobalamin (,VITAMIN B-12,) 1000 MCG/ML injection Inject 1 ml  once weekly for 3 weeks & inject 1 ml once for 5 months dx code: E53.8 10 mL 0  ? famotidine (PEPCID) 20 MG tablet Take 1 tablet (20 mg total) by mouth daily. (Patient taking differently: Take 20 mg by mouth daily as needed for heartburn.) 30 tablet 3  ? ibuprofen (ADVIL) 200 MG tablet Take 400-800 mg by mouth every 6 (six) hours as needed for moderate pain or headache.    ? losartan-hydrochlorothiazide (HYZAAR) 50-12.5 MG tablet Take 1 tablet by mouth daily. 30 tablet 3  ? ondansetron (ZOFRAN-ODT) 4 MG disintegrating tablet Take 1 tablet (4 mg total) by mouth every 8 (eight) hours as needed for nausea or vomiting. 10 tablet 0  ? oxymetazoline (AFRIN) 0.05 % nasal spray Place 1 spray into both nostrils 2 (two) times daily as needed for congestion.    ? SYRINGE-NEEDLE, DISP, 3 ML (EASY TOUCH SAFETY SYRINGE) 22G X 1" 3 ML MISC Syringe-needles needed to injection vitamin b12 1  time weekly for 3 weeks and 1 time monthly for 5 months dx code: E53.8 10 each 0  ? traMADol (ULTRAM) 50 MG tablet Take 1-2 tablets (50-100 mg total) by mouth every 6 (six) hours as needed for moderate pain. 15 tablet 0  ? ?No current facility-administered medications on file prior to visit.  ? ? ?LMP  (LMP Unknown)  ?  ?   ?Objective:  ? Physical Exam ? ? ? ? ?   ?Assessment & Plan:  ? ? ?

## 2022-03-16 IMAGING — US US ABDOMEN COMPLETE
2 series · 14 of 25 positions shown · non-contrast
Comparison: None.

CLINICAL DATA: Abdominal pain

EXAM:
ABDOMEN ULTRASOUND COMPLETE

[Series 1: us abdomen complete · 13 of 71 slices shown (1 of 2)]
[im 1/71]
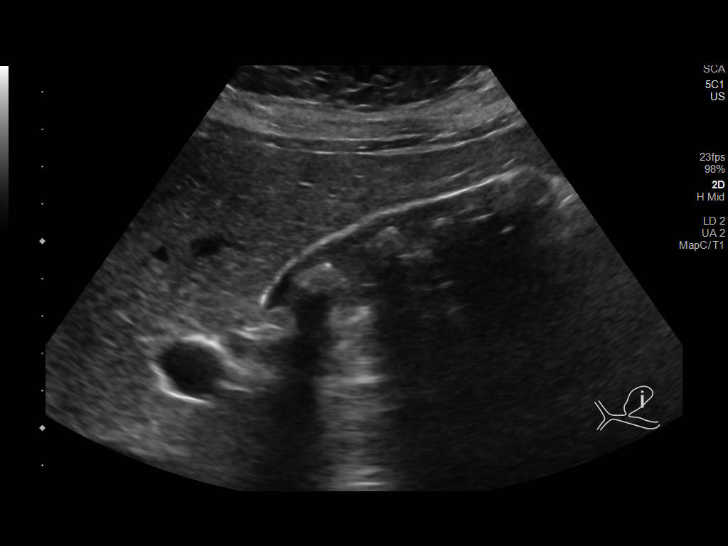
[im 7/71]
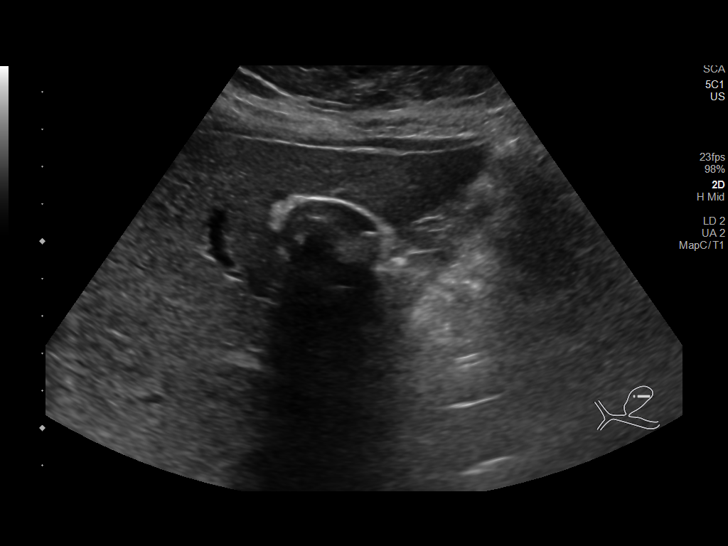
[im 13/71]
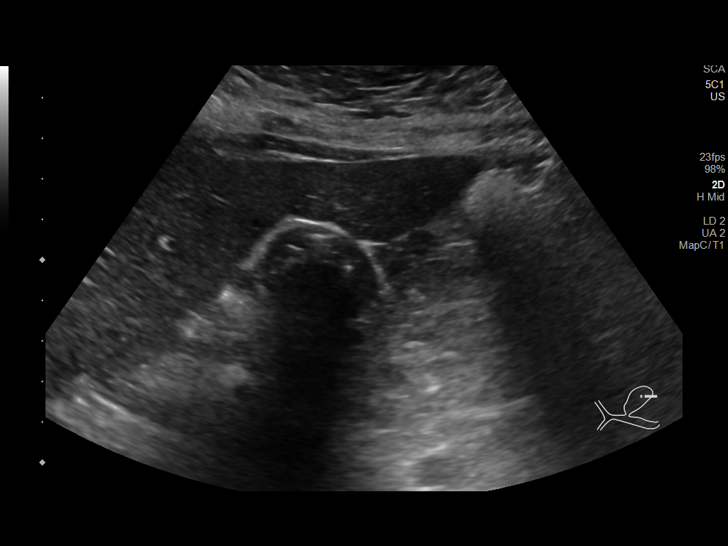
[im 19/71]
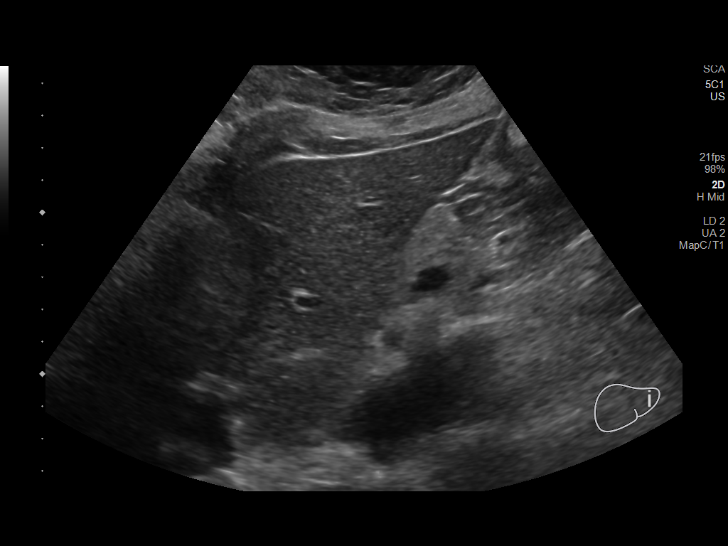
[im 25/71]
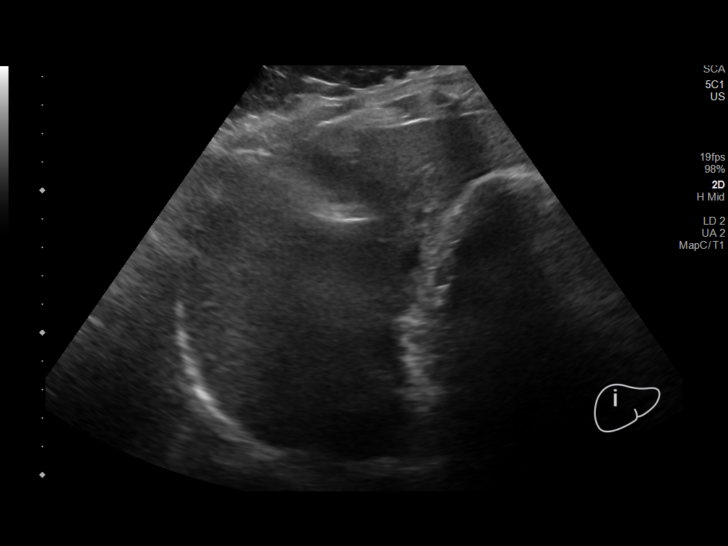
[im 28/71]
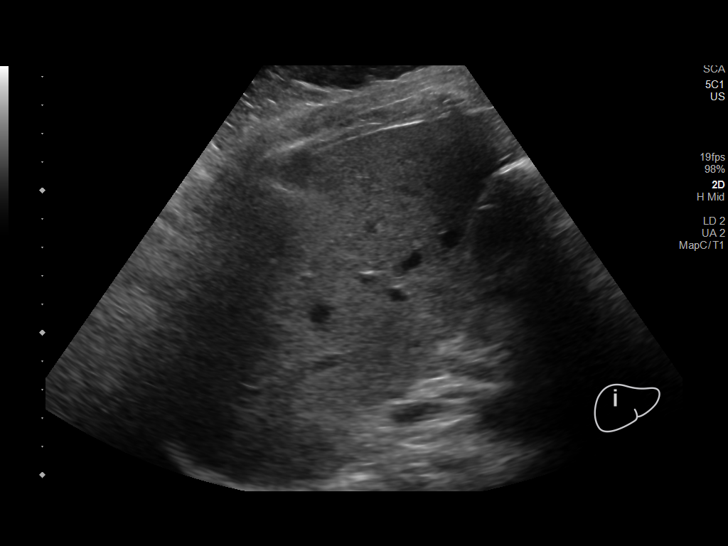
[im 34/71]
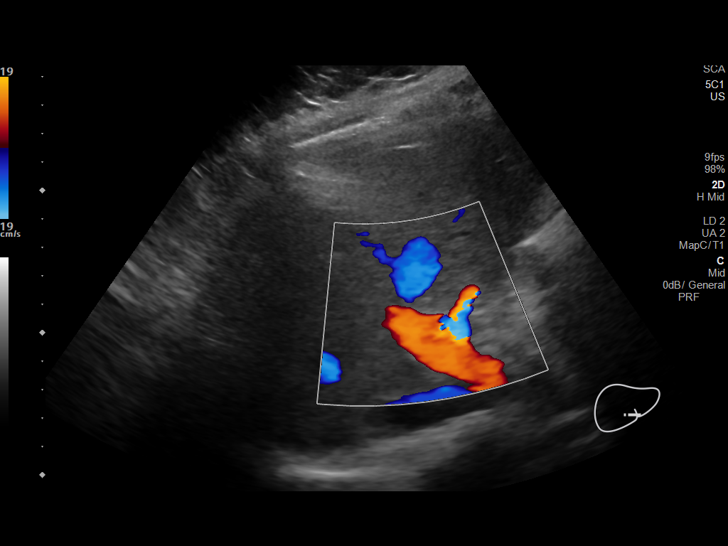
[im 40/71]
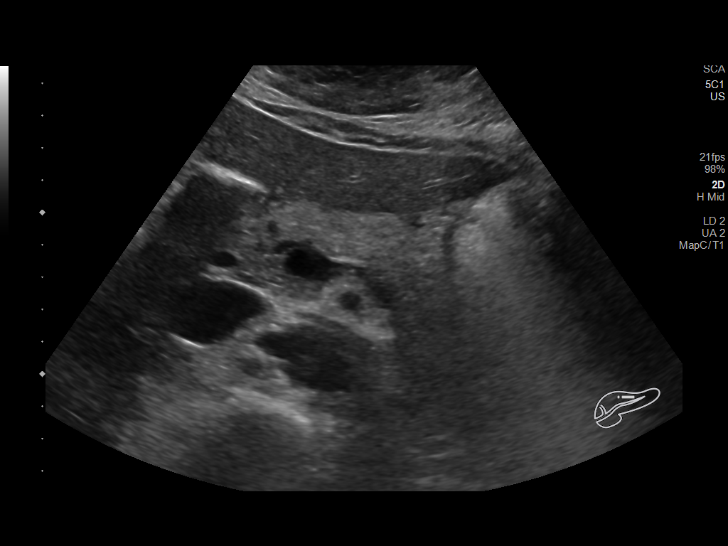
[im 46/71]
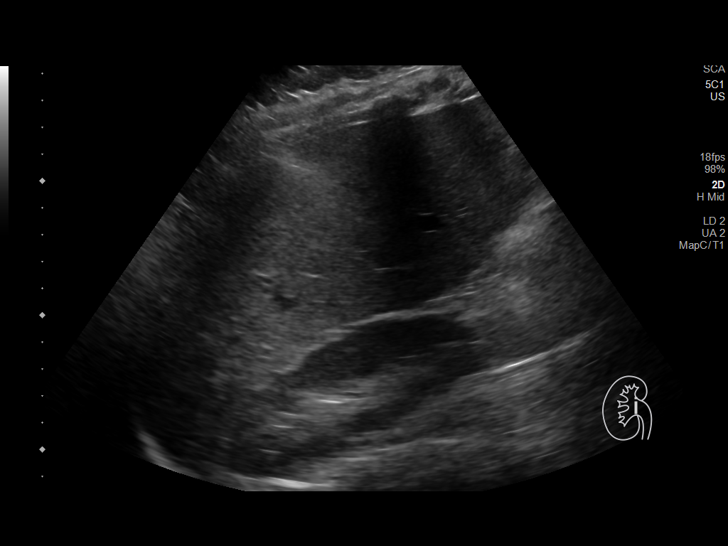
[im 49/71]
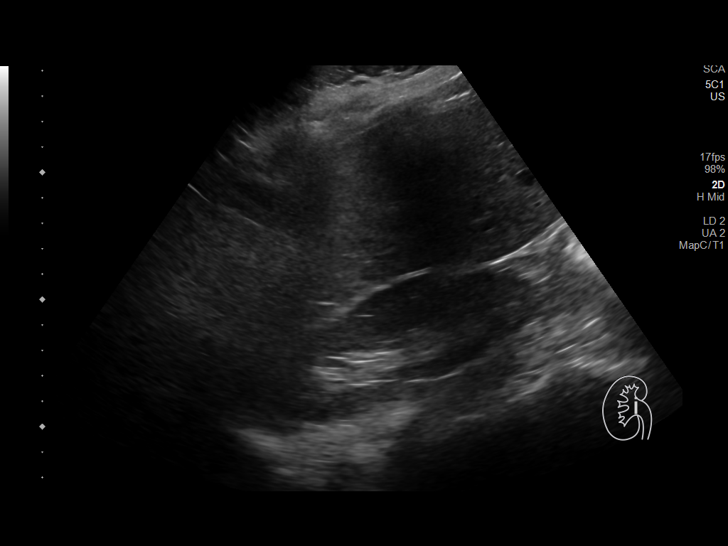
[im 55/71]
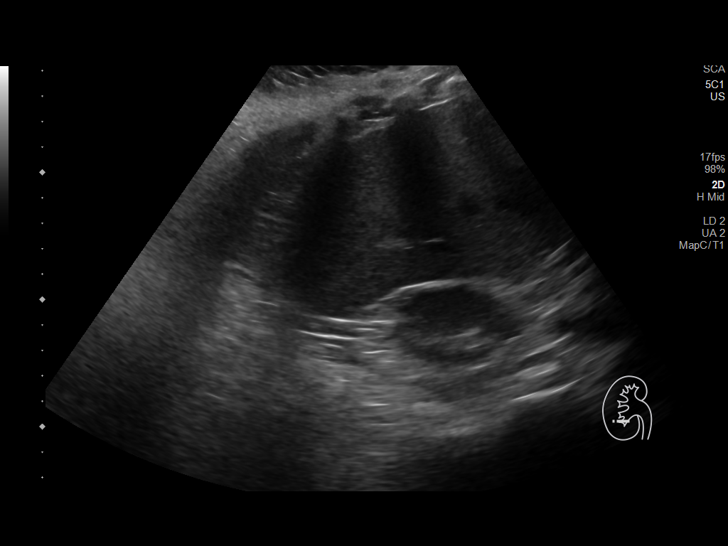
[im 61/71]
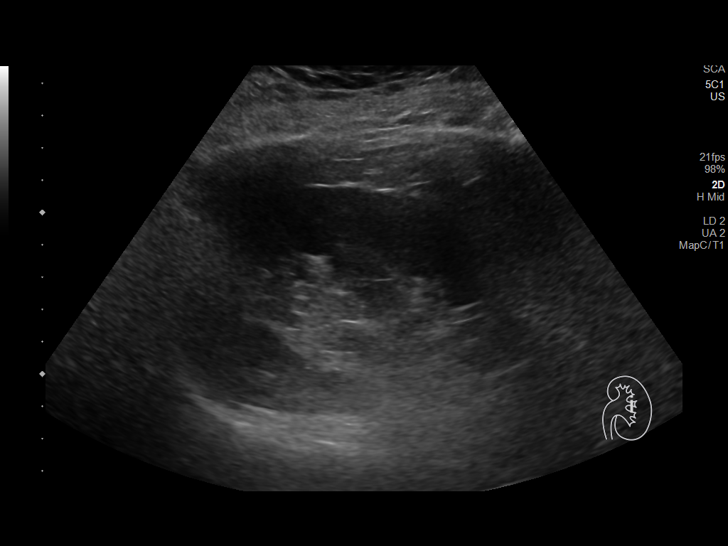
[im 67/71]
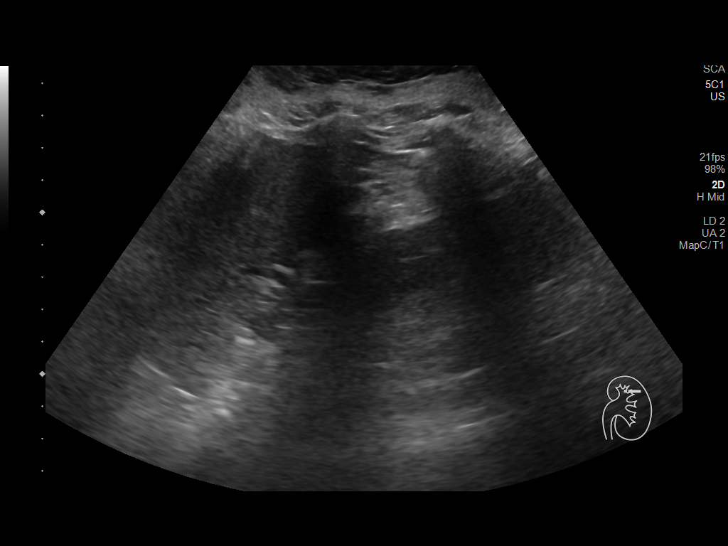

[Series 2: us abdomen complete · 1 of 1 slices shown (2 of 2)]
[im 1/1]
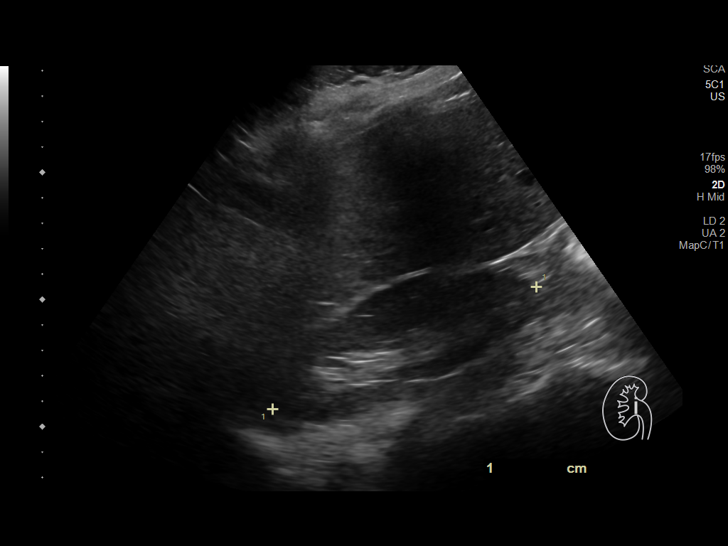

[14 of 25 positions shown; findings below may reference images not displayed]

FINDINGS: Gallbladder: Gallstones are present measuring up to 1.7 cm. No wall
thickening visualized. No sonographic Murphy sign noted by
sonographer.

Common bile duct: Diameter: 2 mm, normal

Liver: No focal lesion identified. Within normal limits in
parenchymal echogenicity. Portal vein is patent on color Doppler
imaging with normal direction of blood flow towards the liver.

IVC: No abnormality visualized.

Pancreas: Visualized portion unremarkable.

Spleen: Size and appearance within normal limits.

Right Kidney: Length: 11.4 cm. Echogenicity within normal limits. No
mass or hydronephrosis visualized.

Left Kidney: Length: 11.3 cm. Echogenicity within normal limits. No
mass or hydronephrosis visualized.

Abdominal aorta: No aneurysm visualized.

Other findings: None.
IMPRESSION: Cholelithiasis.  No sonographic evidence of acute cholecystitis.

## 2022-06-09 DIAGNOSIS — M170 Bilateral primary osteoarthritis of knee: Secondary | ICD-10-CM | POA: Diagnosis not present

## 2022-07-14 DIAGNOSIS — M170 Bilateral primary osteoarthritis of knee: Secondary | ICD-10-CM | POA: Diagnosis not present

## 2022-07-21 ENCOUNTER — Encounter (INDEPENDENT_AMBULATORY_CARE_PROVIDER_SITE_OTHER): Payer: Self-pay

## 2022-08-30 ENCOUNTER — Other Ambulatory Visit: Payer: Self-pay | Admitting: Medical

## 2022-10-01 DIAGNOSIS — M170 Bilateral primary osteoarthritis of knee: Secondary | ICD-10-CM | POA: Diagnosis not present

## 2022-10-11 ENCOUNTER — Encounter (INDEPENDENT_AMBULATORY_CARE_PROVIDER_SITE_OTHER): Payer: Self-pay

## 2022-12-27 DIAGNOSIS — M170 Bilateral primary osteoarthritis of knee: Secondary | ICD-10-CM | POA: Diagnosis not present

## 2023-01-04 ENCOUNTER — Telehealth: Payer: Self-pay | Admitting: Medical

## 2023-01-04 NOTE — Telephone Encounter (Signed)
Pt called stating she was experiencing the following:  -Tightness in chest from time to time - especially when fatigued  -SOB episodes - pt stated these are separate from the chest episodes  - Dizziness and Lightheadedness  Pt was transferred to triage nurse for further eval.

## 2023-01-04 NOTE — Telephone Encounter (Signed)
FYI   Appointment tomorrow 01/05/23

## 2023-01-04 NOTE — Telephone Encounter (Signed)
Nurse Assessment Nurse: Ysidro Evert, RN, Levada Dy Date/Time Eilene Ghazi Time): 01/04/2023 12:44:35 PM Confirm and document reason for call. If symptomatic, describe symptoms. ---Caller states she is having chest pain and pressure for the last 2 months. She has some shortness of breath while walking Does the patient have any new or worsening symptoms? ---Yes Will a triage be completed? ---Yes Related visit to physician within the last 2 weeks? ---No Does the PT have any chronic conditions? (i.e. diabetes, asthma, this includes High risk factors for pregnancy, etc.) ---Yes List chronic conditions. ---osteoarthritis Is the patient pregnant or possibly pregnant? (Ask all females between the ages of 43-55) ---No Is this a behavioral health or substance abuse call? ---No Guidelines Guideline Title Affirmed Question Affirmed Notes Nurse Date/Time Eilene Ghazi Time) Chest Pain [1] Chest pain lasts > 5 minutes AND [2] age > 76 Ysidro Evert, RN, Levada Dy 01/04/2023 12:46:37 PM PLEASE NOTE: All timestamps contained within this report are represented as [place] Standard Time. CONFIDENTIALTY NOTICE: This fax transmission is intended only for the addressee. It contains information that is legally privileged, confidential or otherwise protected from use or disclosure. If you are not the intended recipient, you are strictly prohibited from reviewing, disclosing, copying using or disseminating any of this information or taking any action in reliance on or regarding this information. If you have received this fax in error, please notify us immediately by telephone so that we can arrange for its return to [place]. Phone: [phone], Toll-Free: [phone], Fax: [phone] Page: 2 of 2 Call Id: [id number] [place]. Time Eilene Ghazi Time) Disposition Final User 01/04/2023 12:40:40 PM Send to Urgent Darolyn Rua 01/04/2023 12:52:43 PM Call EMS 911 Now Yes Ysidro Evert, RNLevada Dy 01/04/2023 12:53:21 PM 911 Outcome Documentation  Ysidro Evert, RN, Levada Dy Reason: Caller states she is not going to call an ambulance Final Disposition 01/04/2023 12:52:43 PM Call EMS 911 Now Yes Ysidro Evert, RN, [place] Shutter Disagree/Comply Disagree Caller Understands Yes PreDisposition Did not know what to do Care Advice Given Per Guideline CALL EMS 911 NOW: * Immediate medical attention is needed. You need to hang up and call 911 (or an ambulance). * Triager Discretion: I'll call you back in a few minutes to be sure you were able to reach them. CARE ADVICE given per Chest Pain (Adult) guideline

## 2023-01-04 NOTE — Telephone Encounter (Signed)
Pt stated she isnt experiencing these sx currently and they have been going on since November

## 2023-01-05 ENCOUNTER — Encounter: Payer: BC Managed Care – PPO | Admitting: Medical

## 2023-01-05 ENCOUNTER — Ambulatory Visit (INDEPENDENT_AMBULATORY_CARE_PROVIDER_SITE_OTHER): Payer: BC Managed Care – PPO | Admitting: Medical

## 2023-01-05 ENCOUNTER — Encounter: Payer: Self-pay | Admitting: Medical

## 2023-01-05 VITALS — BP 146/88 | HR 90 | Ht 63.0 in | Wt 236.0 lb

## 2023-01-05 DIAGNOSIS — I10 Essential (primary) hypertension: Secondary | ICD-10-CM | POA: Diagnosis not present

## 2023-01-05 DIAGNOSIS — R739 Hyperglycemia, unspecified: Secondary | ICD-10-CM | POA: Diagnosis not present

## 2023-01-05 DIAGNOSIS — Z1231 Encounter for screening mammogram for malignant neoplasm of breast: Secondary | ICD-10-CM | POA: Diagnosis not present

## 2023-01-05 DIAGNOSIS — R0789 Other chest pain: Secondary | ICD-10-CM

## 2023-01-05 DIAGNOSIS — Z1211 Encounter for screening for malignant neoplasm of colon: Secondary | ICD-10-CM | POA: Diagnosis not present

## 2023-01-05 DIAGNOSIS — Z0000 Encounter for general adult medical examination without abnormal findings: Secondary | ICD-10-CM

## 2023-01-05 DIAGNOSIS — Z0001 Encounter for general adult medical examination with abnormal findings: Secondary | ICD-10-CM | POA: Diagnosis not present

## 2023-01-05 NOTE — Patient Instructions (Addendum)
For you wellness exam today I have ordered cbc, cmp  and lipid panel.(Future and fasting)  Vaccines up to date.   Recommend exercise and healthy diet.  We will let you know lab results as they come in.  Follow up date appointment will be determined after lab review.     2. Atypical chest pain Will go ahead and refer to cardiologist to evaluate further. If pending referral recurrent/ and pesistent pain be seen in the ED. - EKG 12-Lead(sinus rhythm, first degree block, type II st elevation minimal. I don't see in lead III or AVF. Called to discuss. She has cardiology appointment on Monday.  3. Screening for colon cancer Can call them directly to schedule - Ambulatory referral to Gastroenterology  4. Breast cancer screening by mammogram Can schedule down stairs - MM 3D SCREEN BREAST BILATERAL; Future  5. Elevated blood sugar Low sugar diet - Hemoglobin A1c; Future  6. Hypertension, unspecified type Please take losartan/hctz daily. Check bp tomorrow and update me on reading.   7. Obesity discussed diet and maybe medication to help.   Follow up in date to be determined after lab review.   Preventive Care 23-55 Years Old, Female Preventive care refers to lifestyle choices and visits with your health care provider that can promote health and wellness. Preventive care visits are also called wellness exams. What can I expect for my preventive care visit? Counseling Your health care provider may ask you questions about your: Medical history, including: Past medical problems. Family medical history. Pregnancy history. Current health, including: Menstrual cycle. Method of birth control. Emotional well-being. Home life and relationship well-being. Sexual activity and sexual health. Lifestyle, including: Alcohol, nicotine or tobacco, and drug use. Access to firearms. Diet, exercise, and sleep habits. Work and work Statistician. Sunscreen use. Safety issues such as seatbelt  and bike helmet use. Physical exam Your health care provider will check your: Height and weight. These may be used to calculate your BMI (body mass index). BMI is a measurement that tells if you are at a healthy weight. Waist circumference. This measures the distance around your waistline. This measurement also tells if you are at a healthy weight and may help predict your risk of certain diseases, such as type 2 diabetes and high blood pressure. Heart rate and blood pressure. Body temperature. Skin for abnormal spots. What immunizations do I need?  Vaccines are usually given at various ages, according to a schedule. Your health care provider will recommend vaccines for you based on your age, medical history, and lifestyle or other factors, such as travel or where you work. What tests do I need? Screening Your health care provider may recommend screening tests for certain conditions. This may include: Lipid and cholesterol levels. Diabetes screening. This is done by checking your blood sugar (glucose) after you have not eaten for a while (fasting). Pelvic exam and Pap test. Hepatitis B test. Hepatitis C test. HIV (human immunodeficiency virus) test. STI (sexually transmitted infection) testing, if you are at risk. Lung cancer screening. Colorectal cancer screening. Mammogram. Talk with your health care provider about when you should start having regular mammograms. This may depend on whether you have a family history of breast cancer. BRCA-related cancer screening. This may be done if you have a family history of breast, ovarian, tubal, or peritoneal cancers. Bone density scan. This is done to screen for osteoporosis. Talk with your health care provider about your test results, treatment options, and if necessary, the need for more tests.  Follow these instructions at home: Eating and drinking  Eat a diet that includes fresh fruits and vegetables, whole grains, lean protein, and low-fat  dairy products. Take vitamin and mineral supplements as recommended by your health care provider. Do not drink alcohol if: Your health care provider tells you not to drink. You are pregnant, may be pregnant, or are planning to become pregnant. If you drink alcohol: Limit how much you have to 0-1 drink a day. Know how much alcohol is in your drink. In the [place] one drink equals one 12 oz bottle of beer (355 mL), one 5 oz glass of wine (148 mL), or one 1 oz glass of hard liquor (44 mL). Lifestyle Brush your teeth every morning and night with fluoride toothpaste. Floss one time each day. Exercise for at least 30 minutes 5 or more days each week. Do not use any products that contain nicotine or tobacco. These products include cigarettes, chewing tobacco, and vaping devices, such as e-cigarettes. If you need help quitting, ask your health care provider. Do not use drugs. If you are sexually active, practice safe sex. Use a condom or other form of protection to prevent STIs. If you do not wish to become pregnant, use a form of birth control. If you plan to become pregnant, see your health care provider for a prepregnancy visit. Take aspirin only as told by your health care provider. Make sure that you understand how much to take and what form to take. Work with your health care provider to find out whether it is safe and beneficial for you to take aspirin daily. Find healthy ways to manage stress, such as: Meditation, yoga, or listening to music. Journaling. Talking to a trusted person. Spending time with friends and family. Minimize exposure to UV radiation to reduce your risk of skin cancer. Safety Always wear your seat belt while driving or riding in a vehicle. Do not drive: If you have been drinking alcohol. Do not ride with someone who has been drinking. When you are tired or distracted. While texting. If you have been using any mind-altering substances or drugs. Wear a helmet and other  protective equipment during sports activities. If you have firearms in your house, make sure you follow all gun safety procedures. Seek help if you have been physically or sexually abused. What's next? Visit your health care provider once a year for an annual wellness visit. Ask your health care provider how often you should have your eyes and teeth checked. Stay up to date on all vaccines. This information is not intended to replace advice given to you by your health care provider. Make sure you discuss any questions you have with your health care provider. Document Revised: 05/27/2021 Document Reviewed: 05/27/2021 Elsevier Patient Education  [place].

## 2023-01-05 NOTE — Addendum Note (Signed)
Addended by: Jeronimo Greaves on: 01/05/2023 03:59 PM   Modules accepted: Orders

## 2023-01-05 NOTE — Progress Notes (Signed)
Subjective:    Patient ID: Whitney Gray, female    DOB: [birth year], 52 y.o.   MRN: [id number]  HPI  Pt in for cpe.      Pt needs colonoscopy screening, pap and mammogram.   She mentioned yesterday on phone call occasional intermittent chest pain. Last time had chest pain was one week. She states sharp pain that will last 5 minutes. Most of time and goes. Other times might will last all day. Pain usually transient. Notices more when working and stressed. Also long hours. Sometimes will feel short of breath. But with this will get dry cough. No jaw pain or shoulder pain with these chest pains.   She states had these chest pain events started around November after uri  She has random cough after that uri. Random sneeze.  Pt is obese and she wants to lose weight. Pt told she needs knee replacement but can't get surgery unless loses weight. No hx of pancreatitis. No fh of thyromedullary cancer.  Pt is not on bp medication today. Is on losartan/hctz 50/12.5 mg daiy.   Review of Systems  Constitutional:  Negative for chills, fatigue and fever.  HENT:  Negative for congestion, ear discharge and ear pain.   Respiratory:  Negative for cough, chest tightness, shortness of breath and wheezing.   Cardiovascular:  Negative for chest pain and palpitations.       No chest pain now but some intemrittently. See hpi.  Gastrointestinal:  Negative for abdominal pain, constipation and nausea.  Genitourinary:  Negative for dysuria.  Musculoskeletal:  Negative for back pain, joint swelling and myalgias.       Knee pain.  Neurological:  Negative for dizziness, seizures, syncope, weakness, light-headedness and headaches.  Hematological:  Negative for adenopathy. Does not bruise/bleed easily.  Psychiatric/Behavioral:  Negative for behavioral problems, decreased concentration, dysphoric mood and hallucinations.    Past Medical History:  Diagnosis Date   Amenorrhea    Anemia    Arthritis     Bilateral knees   Asthma    Depression with anxiety 03/25/2020   Heart murmur    Hypertension    Pituitary adenoma (HCC)    Prolactinoma ([place])      Social History   Socioeconomic History   Marital status: Married    Spouse name: Not on file   Number of children: 2   Years of education: 18   Highest education level: Not on file  Occupational History   Occupation: Med Tech  Tobacco Use   Smoking status: Never   Smokeless tobacco: Never  Vaping Use   Vaping Use: Never used  Substance and Sexual Activity   Alcohol use: No    Alcohol/week: 0.0 standard drinks of alcohol   Drug use: No   Sexual activity: Not Currently    Partners: Male    Birth control/protection: None  Other Topics Concern   Not on file  Social History Narrative   Fun: Play games   Denies abuse and feels safe at home.   Denies religious beliefs effecting health care.    Social Determinants of Health   Financial Resource Strain: Not on file  Food Insecurity: Not on file  Transportation Needs: Not on file  Physical Activity: Not on file  Stress: Not on file  Social Connections: Not on file  Intimate Partner Violence: Not on file    Past Surgical History:  Procedure Laterality Date   CESAREAN SECTION  2004   CHOLECYSTECTOMY N/A 09/09/2021   Procedure:  LAPAROSCOPIC CHOLECYSTECTOMY WITH INTRAOPERATIVE CHOLANGIOGRAM;  Surgeon: Armandina Gemma, MD;  Location: [facility];  Service: General;  Laterality: N/A;   TUBAL LIGATION      Family History  Problem Relation Age of Onset   Asthma Mother    Alcohol abuse Father    Arthritis Father    Heart disease Father    Prostate cancer Maternal Grandfather    Diabetes Maternal Grandfather     Allergies  Allergen Reactions   Latex Rash   Codeine Nausea Only    Per pt,nausea,vomiting,dizzy.     Current Outpatient Medications on File Prior to Visit  Medication Sig Dispense Refill   albuterol (VENTOLIN HFA) 108 (90 Base) MCG/ACT inhaler Inhale 2 puffs into  the lungs every 6 (six) hours as needed. 18 g 0   Aspirin-Acetaminophen-Caffeine (GOODY HEADACHE PO) Take 1 packet by mouth daily as needed (pain).     Aspirin-Caffeine (BC FAST PAIN RELIEF PO) Take 1 packet by mouth daily as needed (pain).     benzonatate (TESSALON) 100 MG capsule Take 1 capsule (100 mg total) by mouth 3 (three) times daily as needed for cough. 21 capsule 0   bismuth subsalicylate (PEPTO BISMOL) 262 MG/15ML suspension Take 30 mLs by mouth every 6 (six) hours as needed for indigestion or diarrhea or loose stools.     cyanocobalamin (,VITAMIN B-12,) 1000 MCG/ML injection Inject 1 ml once weekly for 3 weeks & inject 1 ml once for 5 months dx code: E53.8 10 mL 0   famotidine (PEPCID) 20 MG tablet Take 1 tablet (20 mg total) by mouth daily. (Patient taking differently: Take 20 mg by mouth daily as needed for heartburn.) 30 tablet 3   fluticasone (FLONASE) 50 MCG/ACT nasal spray Place 2 sprays into both nostrils daily. 16 g 1   HYDROcodone bit-homatropine (HYCODAN) 5-1.5 MG/5ML syrup Take 5 mLs by mouth every 8 (eight) hours as needed for cough. 120 mL 0   ibuprofen (ADVIL) 200 MG tablet Take 400-800 mg by mouth every 6 (six) hours as needed for moderate pain or headache.     losartan-hydrochlorothiazide (HYZAAR) 50-12.5 MG tablet Take 1 tablet by mouth daily. 90 tablet 0   ondansetron (ZOFRAN-ODT) 4 MG disintegrating tablet Take 1 tablet (4 mg total) by mouth every 8 (eight) hours as needed for nausea or vomiting. 10 tablet 0   oxymetazoline (AFRIN) 0.05 % nasal spray Place 1 spray into both nostrils 2 (two) times daily as needed for congestion.     SYRINGE-NEEDLE, DISP, 3 ML (EASY TOUCH SAFETY SYRINGE) 22G X 1" 3 ML MISC Syringe-needles needed to injection vitamin b12 1 time weekly for 3 weeks and 1 time monthly for 5 months dx code: E53.8 10 each 0   traMADol (ULTRAM) 50 MG tablet Take 1-2 tablets (50-100 mg total) by mouth every 6 (six) hours as needed for moderate pain. 15 tablet 0    No current facility-administered medications on file prior to visit.    BP (!) 146/88   Pulse 90   Ht '5\' 3"'$  (1.6 m)   Wt 236 lb (107 kg)   LMP  (LMP Unknown)   SpO2 97%   BMI 41.81 kg/m        Objective:   Physical Exam  General Mental Status- Alert. General Appearance- Not in acute distress.   Skin General: Color- Normal Color. Moisture- Normal Moisture.  Neck Carotid Arteries- Normal color. Moisture- Normal Moisture. No carotid bruits. No JVD.  Chest and Lung Exam Auscultation: Breath Sounds:-Normal.  Cardiovascular Auscultation:Rythm- Regular. Murmurs & Other Heart Sounds:Auscultation of the heart reveals- No Murmurs.  Abdomen Inspection:-Inspeection Normal. Palpation/Percussion:Note:No mass. Palpation and Percussion of the abdomen reveal- Non Tender, Non Distended + BS, no rebound or guarding.  Neurologic Cranial Nerve exam:- CN III-XII intact(No nystagmus), symmetric smile. Strength:- 5/5 equal and symmetric strength both upper and lower extremities.    Anterior thorax- no chest wall tenderness to palpation.     Assessment & Plan:   For you wellness exam today I have ordered cbc, cmp  and lipid panel.(Future and fasting)  Vaccines up to date.   Recommend exercise and healthy diet.  We will let you know lab results as they come in.  Follow up date appointment will be determined after lab review.     2. Atypical chest pain Will go ahead and refer to cardiologist to evaluate further. If pending referral recurrent/ and pesistent pain be seen in the ED. - EKG 12-Lead  3. Screening for colon cancer Can call them directly to schedule - Ambulatory referral to Gastroenterology  4. Breast cancer screening by mammogram Can schedule down stairs - MM 3D SCREEN BREAST BILATERAL; Future  5. Elevated blood sugar Low sugar diet - Hemoglobin A1c; Future  6. Hypertension, unspecified type Please take losartan/hctz daily. Check bp tomorrow and update  me on reading.   7. Obesity discussed diet and maybe medication to help.   Follow up in date to be determined after lab review.  99213 charge as did address atypical chest pain, obesity, htn and elevated blood sugar.

## 2023-01-06 ENCOUNTER — Encounter: Payer: BC Managed Care – PPO | Admitting: Medical

## 2023-01-07 ENCOUNTER — Telehealth (HOSPITAL_BASED_OUTPATIENT_CLINIC_OR_DEPARTMENT_OTHER): Payer: Self-pay

## 2023-01-13 NOTE — Progress Notes (Signed)
Referring-Edward Saguier PA-C Reason for referral-chest pain  HPI: 52 year old female for evaluation of chest pain at request of [employer].  Patient has had occasional pain in the left lateral chest area just inferior to the breast.  It is described as a squeezing pain lasting several minutes and resolving spontaneously.  No radiation or associated symptoms.  It is not pleuritic or exertional.  Resolves spontaneously.  She otherwise has some dyspnea on exertion that she attributes to deconditioning.  She denies orthopnea, PND, palpitations or syncope.  She does have occasional pedal edema by her report.  Because of the above cardiology asked to evaluate.  She also has a history of murmur.  Current Outpatient Medications  Medication Sig Dispense Refill   albuterol (VENTOLIN HFA) 108 (90 Base) MCG/ACT inhaler Inhale 2 puffs into the lungs every 6 (six) hours as needed. 18 g 0   Aspirin-Acetaminophen-Caffeine (GOODY HEADACHE PO) Take 1 packet by mouth daily as needed (pain).     Aspirin-Caffeine (BC FAST PAIN RELIEF PO) Take 1 packet by mouth daily as needed (pain).     bismuth subsalicylate (PEPTO BISMOL) 262 MG/15ML suspension Take 30 mLs by mouth every 6 (six) hours as needed for indigestion or diarrhea or loose stools.     cyanocobalamin (,VITAMIN B-12,) 1000 MCG/ML injection Inject 1 ml once weekly for 3 weeks & inject 1 ml once for 5 months dx code: E53.8 10 mL 0   ibuprofen (ADVIL) 200 MG tablet Take 400-800 mg by mouth every 6 (six) hours as needed for moderate pain or headache.     losartan-hydrochlorothiazide (HYZAAR) 50-12.5 MG tablet Take 1 tablet by mouth daily. 90 tablet 0   oxymetazoline (AFRIN) 0.05 % nasal spray Place 1 spray into both nostrils 2 (two) times daily as needed for congestion.     SYRINGE-NEEDLE, DISP, 3 ML (EASY TOUCH SAFETY SYRINGE) 22G X 1" 3 ML MISC Syringe-needles needed to injection vitamin b12 1 time weekly for 3 weeks and 1 time monthly for 5 months  dx code: E53.8 10 each 0   No current facility-administered medications for this visit.    Allergies  Allergen Reactions   Latex Rash   Codeine Nausea Only    Per pt,nausea,vomiting,dizzy.      Past Medical History:  Diagnosis Date   Amenorrhea    Anemia    Arthritis    Bilateral knees   Asthma    Depression with anxiety 03/25/2020   Heart murmur    Hypertension    Pituitary adenoma ([place])    Prolactinoma ([place])     Past Surgical History:  Procedure Laterality Date   CESAREAN SECTION  2004   CHOLECYSTECTOMY N/A 09/09/2021   Procedure: LAPAROSCOPIC CHOLECYSTECTOMY WITH INTRAOPERATIVE CHOLANGIOGRAM;  Surgeon: Armandina Gemma, MD;  Location: [facility];  Service: General;  Laterality: N/A;   TUBAL LIGATION      Social History   Socioeconomic History   Marital status: Married    Spouse name: Not on file   Number of children: 2   Years of education: 18   Highest education level: Not on file  Occupational History   Occupation: Med Tech  Tobacco Use   Smoking status: Never   Smokeless tobacco: Never  Vaping Use   Vaping Use: Never used  Substance and Sexual Activity   Alcohol use: No    Alcohol/week: 0.0 standard drinks of alcohol   Drug use: No   Sexual activity: Not Currently    Partners: Male  Birth control/protection: None  Other Topics Concern   Not on file  Social History Narrative   Fun: Play games   Denies abuse and feels safe at home.   Denies religious beliefs effecting health care.    Social Determinants of Health   Financial Resource Strain: Not on file  Food Insecurity: Not on file  Transportation Needs: Not on file  Physical Activity: Not on file  Stress: Not on file  Social Connections: Not on file  Intimate Partner Violence: Not on file    Family History  Problem Relation Age of Onset   Heart disease Mother    Asthma Mother    Alcohol abuse Father    Arthritis Father    Heart disease Father    Prostate cancer Maternal Grandfather     Diabetes Maternal Grandfather     ROS: Knee arthralgias but no fevers or chills, productive cough, hemoptysis, dysphasia, odynophagia, melena, hematochezia, dysuria, hematuria, rash, seizure activity, orthopnea, PND, pedal edema, claudication. Remaining systems are negative.  Physical Exam:   Blood pressure 132/82, pulse 80, height '5\' 3"'$  (1.6 m), weight 239 lb (108.4 kg), SpO2 98 %.  General:  Well developed/well nourished in NAD Skin warm/dry Patient not depressed No peripheral clubbing Back-normal HEENT-normal/normal eyelids Neck supple/normal carotid upstroke bilaterally; no bruits; no JVD; no thyromegaly chest - CTA/ normal expansion CV - RRR/normal S1 and S2; no rubs or gallops;  PMI nondisplaced; 2/6 systolic murmur apex. Abdomen -NT/ND, no HSM, no mass, + bowel sounds, no bruit 2+ femoral pulses, no bruits Ext-no edema, chords, 2+ DP Neuro-grossly nonfocal  ECG -January 05, 2023-normal sinus rhythm, first-degree AV block.  Personally reviewed  A/P  1 chest pain-symptoms are very atypical.  Recent electrocardiogram showed no ST changes.  We discussed options today including cardiac CTA.  However she would prefer to be conservative at this point.  If her symptoms worsen she will contact us and would be agreeable to proceed at that time.  2 hypertension-BP controlled; continue present meds and follow.  3 murmur-we will arrange an echocardiogram to further assess.  4 obesity-we discussed the importance of diet, exercise and weight loss.  Kirk Ruths, MD

## 2023-01-17 ENCOUNTER — Encounter: Payer: Self-pay | Admitting: Cardiology

## 2023-01-17 ENCOUNTER — Ambulatory Visit: Payer: BC Managed Care – PPO | Attending: Cardiology | Admitting: Cardiology

## 2023-01-17 VITALS — BP 132/82 | HR 80 | Ht 63.0 in | Wt 239.0 lb

## 2023-01-17 DIAGNOSIS — R0609 Other forms of dyspnea: Secondary | ICD-10-CM | POA: Diagnosis not present

## 2023-01-17 DIAGNOSIS — R072 Precordial pain: Secondary | ICD-10-CM

## 2023-01-17 NOTE — Patient Instructions (Signed)
    Testing/Procedures:  Your physician has requested that you have an echocardiogram. Echocardiography is a painless test that uses sound waves to create images of your heart. It provides your doctor with information about the size and shape of your heart and how well your heart's chambers and valves are working. This procedure takes approximately one hour. There are no restrictions for this procedure. Please do NOT wear cologne, perfume, aftershave, or lotions (deodorant is allowed). Please arrive 15 minutes prior to your appointment time. [place], you and your health needs are our priority.  As part of our continuing mission to provide you with exceptional heart care, we have created designated Provider Care Teams.  These Care Teams include your primary Cardiologist (physician) and Advanced Practice Providers (APPs -  Physician Assistants and Nurse Practitioners) who all work together to provide you with the care you need, when you need it.  We recommend signing up for the patient portal called "MyChart".  Sign up information is provided on this After Visit Summary.  MyChart is used to connect with patients for Virtual Visits (Telemedicine).  Patients are able to view lab/test results, encounter notes, upcoming appointments, etc.  Non-urgent messages can be sent to your provider as well.   To learn more about what you can do with MyChart, go to [email or web address].    Your next appointment:    AS NEEDED  Provider:   Kirk Ruths MD

## 2023-01-18 DIAGNOSIS — Z0000 Encounter for general adult medical examination without abnormal findings: Secondary | ICD-10-CM | POA: Diagnosis not present

## 2023-02-10 ENCOUNTER — Ambulatory Visit (HOSPITAL_COMMUNITY): Payer: BC Managed Care – PPO | Attending: Cardiology

## 2023-02-10 DIAGNOSIS — R072 Precordial pain: Secondary | ICD-10-CM | POA: Diagnosis not present

## 2023-02-10 DIAGNOSIS — R0609 Other forms of dyspnea: Secondary | ICD-10-CM | POA: Diagnosis not present

## 2023-02-10 LAB — ECHOCARDIOGRAM COMPLETE
Area-P 1/2: 3.33 cm2
S' Lateral: 2.7 cm

## 2023-02-11 ENCOUNTER — Telehealth: Payer: Self-pay | Admitting: Medical

## 2023-02-11 MED ORDER — LOSARTAN POTASSIUM-HCTZ 50-12.5 MG PO TABS: 1.0000 | ORAL_TABLET | Freq: Every day | ORAL | 0 refills | Status: DC

## 2023-02-11 NOTE — Telephone Encounter (Signed)
Rx sent 

## 2023-02-11 NOTE — Telephone Encounter (Signed)
Prescription Request  02/11/2023  Is this a "Controlled Substance" medicine? No  LOV: 01/05/2023  What is the name of the medication or equipment?   losartan-hydrochlorothiazide (HYZAAR) 50-12.5 MG tablet [id number]   Pt also wanted to look into weight loss medication (Pt stated it was discussed in last appt)  Have you contacted your pharmacy to request a refill? No   Which pharmacy would you like this sent to?   [facility], [place] [address] Phone: [phone] Fax: [phone]    Patient notified that their request is being sent to the clinical staff for review and that they should receive a response within 2 business days.   Please advise at Mobile [phone] (mobile)

## 2023-02-14 ENCOUNTER — Encounter: Payer: Self-pay | Admitting: *Deleted

## 2023-02-15 NOTE — Telephone Encounter (Signed)
Pt called to follow up  on meds status. Advised pt that the losartan was sent in to [facility]. Pt asked is weight loss medication had been sent in. Advised there was not one sent in for her and advised a message would be sent to look into this.

## 2023-02-22 NOTE — Telephone Encounter (Signed)
Sent pt mychart message

## 2023-06-06 DIAGNOSIS — M170 Bilateral primary osteoarthritis of knee: Secondary | ICD-10-CM | POA: Diagnosis not present

## 2023-07-06 ENCOUNTER — Telehealth (INDEPENDENT_AMBULATORY_CARE_PROVIDER_SITE_OTHER): Payer: BC Managed Care – PPO | Admitting: Medical

## 2023-07-06 DIAGNOSIS — R0981 Nasal congestion: Secondary | ICD-10-CM

## 2023-07-06 DIAGNOSIS — M25561 Pain in right knee: Secondary | ICD-10-CM | POA: Diagnosis not present

## 2023-07-06 DIAGNOSIS — M25562 Pain in left knee: Secondary | ICD-10-CM

## 2023-07-06 DIAGNOSIS — Z6841 Body Mass Index (BMI) 40.0 and over, adult: Secondary | ICD-10-CM

## 2023-07-06 DIAGNOSIS — G4700 Insomnia, unspecified: Secondary | ICD-10-CM | POA: Diagnosis not present

## 2023-07-06 DIAGNOSIS — R739 Hyperglycemia, unspecified: Secondary | ICD-10-CM

## 2023-07-06 MED ORDER — TRAZODONE HCL 50 MG PO TABS: 25.0000 mg | ORAL_TABLET | Freq: Every evening | ORAL | 0 refills | Status: DC | PRN

## 2023-07-06 MED ORDER — WEGOVY 0.25 MG/0.5ML ~~LOC~~ SOAJ: 0.2500 mg | SUBCUTANEOUS | 0 refills | Status: DC

## 2023-07-06 NOTE — Patient Instructions (Addendum)
1. Insomnia, unspecified type -rx trazadone. If does not work let me know. Other option short course ativan. Want to avoid ambien for reasons we discussed.  2. Nasal congestion chronic. Hx of allergies. -flonase nasal spray.  3. Class 3 severe obesity due to excess calories without serious comorbidity with body mass index (BMI) of 40.0 to 44.9 in adult (HCC) -diet and wegovy. Hope it is covered. Rx advisement given  4. Pain in both knees, unspecified chronicity - if can loose weight ortho states surgery is option  5. Elevated blood sugar  - low sugar diet. Future A1c and cmp  Follow up 1 month or sooner.

## 2023-07-06 NOTE — Progress Notes (Signed)
   Subjective:    Patient ID: Whitney Gray, female    DOB: [birth year], 52 y.o.   MRN: [id number]  HPI  Virtual Visit via Video Note  I connected with Marjory Lies on 07/06/23 at  2:20 PM EDT by a video enabled telemedicine application and verified that I am speaking with the correct person using two identifiers.  Location: Patient: home Provider: office   I discussed the limitations of evaluation and management by telemedicine and the availability of in person appointments. The patient expressed understanding and agreed to proceed.  History of Present Illness: Pt has insomnia over past week. Pt mom recently passed from cancer. She has trouble falling asleep and staying asleep. Pt states in the past she used [place] very briefly years ago. Pt in past woke up and had long conversation with aunt once.  Pt also reports some nasal congestion.   Pt is obese. BMI of 42 in February. Elevated sugar in the past. Pt denies any family of thyroid cancer. Pt states her mom cancer was esophageal CA. Pt never had pancreatitis.  Also reports severe knee problems/pain. Hurts to walk both sides. Ortho told needs surgery but can't get since too obese.     Observations/Objective: General-no acute distress, pleasant, oriented. Lungs- on inspection lungs appear unlabored. Neck- no tracheal deviation or jvd on inspection. Neuro- gross motor function appears intact.   Assessment and Plan: Patient Instructions  1. Insomnia, unspecified type -rx trazadone. If does not work let me know. Other option short course ativan. Want to avoid ambien for reasons we discussed.  2. Nasal congestion chronic. Hx of allergies. -flonase nasal spray.  3. Class 3 severe obesity due to excess calories without serious comorbidity with body mass index (BMI) of 40.0 to 44.9 in adult (HCC) -diet and wegovy. Hope it is covered. Rx advisement given  4. Pain in both knees, unspecified chronicity - if can loose weight ortho  states surgery is option  5. Elevated blood sugar  - low sugar diet. Future A1c and cmp  Follow up 1 month or sooner.  Esperanza Richters, PA-C   Follow Up Instructions:    I discussed the assessment and treatment plan with the patient. The patient was provided an opportunity to ask questions and all were answered. The patient agreed with the plan and demonstrated an understanding of the instructions.   The patient was advised to call back or seek an in-person evaluation if the symptoms worsen or if the condition fails to improve as anticipated.    Esperanza Richters, PA-C     Review of Systems     Objective:   Physical Exam        Assessment & Plan:

## 2023-07-07 ENCOUNTER — Encounter: Payer: Self-pay | Admitting: Medical

## 2023-08-22 ENCOUNTER — Encounter: Payer: Self-pay | Admitting: Medical

## 2023-09-08 ENCOUNTER — Telehealth: Payer: Self-pay | Admitting: Medical

## 2023-09-08 NOTE — Telephone Encounter (Signed)
Pt talking via mychart with PCP

## 2023-09-08 NOTE — Telephone Encounter (Signed)
Pt called and wanted to request if she can receive a copy of her prescriptions via email to [email or web address]. Please advise pt.

## 2023-09-08 NOTE — Telephone Encounter (Signed)
What labs is she needing sent to [facility] there are several labs in future

## 2023-09-09 NOTE — Telephone Encounter (Signed)
Pt isnt a [facility] employee , she lives in [place] & labs werent done

## 2023-09-12 NOTE — Telephone Encounter (Signed)
Labs faxed to pt email

## 2023-09-19 DIAGNOSIS — M170 Bilateral primary osteoarthritis of knee: Secondary | ICD-10-CM | POA: Diagnosis not present

## 2023-10-07 DIAGNOSIS — B084 Enteroviral vesicular stomatitis with exanthem: Secondary | ICD-10-CM | POA: Diagnosis not present

## 2023-10-17 ENCOUNTER — Other Ambulatory Visit: Payer: Self-pay | Admitting: Medical

## 2023-10-18 ENCOUNTER — Other Ambulatory Visit: Payer: Self-pay | Admitting: Medical

## 2023-11-01 ENCOUNTER — Other Ambulatory Visit: Payer: Self-pay | Admitting: Medical

## 2023-11-01 ENCOUNTER — Telehealth: Payer: Self-pay | Admitting: Medical

## 2023-11-01 ENCOUNTER — Other Ambulatory Visit: Payer: Self-pay

## 2023-11-01 DIAGNOSIS — R739 Hyperglycemia, unspecified: Secondary | ICD-10-CM

## 2023-11-01 DIAGNOSIS — I10 Essential (primary) hypertension: Secondary | ICD-10-CM

## 2023-11-01 NOTE — Telephone Encounter (Signed)
Labs faxed

## 2023-11-01 NOTE — Telephone Encounter (Signed)
Patient would like orders faxed to [facility] in [place] as she is there now. Please fax to [phone]

## 2023-11-02 LAB — LIPID PANEL
Chol/HDL Ratio: 4.5 ratio — ABNORMAL HIGH (ref 0.0–4.4)
Cholesterol, Total: 150 mg/dL (ref 100–199)
HDL: 33 mg/dL — ABNORMAL LOW (ref >39)
LDL Chol Calc (NIH): 75 mg/dL (ref 0–99)
Triglycerides: 259 mg/dL — ABNORMAL HIGH (ref 0–149)
VLDL Cholesterol Cal: 42 mg/dL — ABNORMAL HIGH (ref 5–40)

## 2023-11-02 LAB — COMPREHENSIVE METABOLIC PANEL
ALT: 14 [IU]/L (ref 0–32)
AST: 19 [IU]/L (ref 0–40)
Albumin: 4.1 g/dL (ref 3.8–4.9)
Alkaline Phosphatase: 91 [IU]/L (ref 44–121)
BUN/Creatinine Ratio: 13 (ref 9–23)
BUN: 13 mg/dL (ref 6–24)
Bilirubin Total: 0.4 mg/dL (ref 0.0–1.2)
CO2: 26 mmol/L (ref 20–29)
Calcium: 9.5 mg/dL (ref 8.7–10.2)
Chloride: 102 mmol/L (ref 96–106)
Creatinine, Ser: 0.97 mg/dL (ref 0.57–1.00)
Globulin, Total: 3.1 g/dL (ref 1.5–4.5)
Glucose: 96 mg/dL (ref 70–99)
Potassium: 4.4 mmol/L (ref 3.5–5.2)
Sodium: 141 mmol/L (ref 134–144)
Total Protein: 7.2 g/dL (ref 6.0–8.5)
eGFR: 69 mL/min/{1.73_m2} (ref >59)

## 2023-11-02 LAB — HEMOGLOBIN A1C
Est. average glucose Bld gHb Est-mCnc: 137 mg/dL
Hgb A1c MFr Bld: 6.4 % — ABNORMAL HIGH (ref 4.8–5.6)

## 2023-11-02 LAB — CBC WITH DIFFERENTIAL/PLATELET
Basophils Absolute: 0 10*3/uL (ref 0.0–0.2)
Basos: 0 %
EOS (ABSOLUTE): 0.2 10*3/uL (ref 0.0–0.4)
Eos: 3 %
Hematocrit: 35.4 % (ref 34.0–46.6)
Hemoglobin: 11.3 g/dL (ref 11.1–15.9)
Immature Grans (Abs): 0.1 10*3/uL (ref 0.0–0.1)
Immature Granulocytes: 1 %
Lymphocytes Absolute: 1.1 10*3/uL (ref 0.7–3.1)
Lymphs: 16 %
MCH: 27.3 pg (ref 26.6–33.0)
MCHC: 31.9 g/dL (ref 31.5–35.7)
MCV: 86 fL (ref 79–97)
Monocytes Absolute: 0.5 10*3/uL (ref 0.1–0.9)
Monocytes: 7 %
Neutrophils Absolute: 5.2 10*3/uL (ref 1.4–7.0)
Neutrophils: 73 %
Platelets: 228 10*3/uL (ref 150–450)
RBC: 4.14 x10E6/uL (ref 3.77–5.28)
RDW: 14.7 % (ref 11.7–15.4)
WBC: 7 10*3/uL (ref 3.4–10.8)

## 2023-11-05 MED ORDER — FENOFIBRATE 48 MG PO TABS: 48.0000 mg | ORAL_TABLET | Freq: Every day | ORAL | 3 refills | Status: DC

## 2023-11-05 NOTE — Addendum Note (Signed)
Addended by: Gwenevere Abbot on: 11/05/2023 09:55 PM   Modules accepted: Orders

## 2023-11-25 ENCOUNTER — Telehealth: Payer: Self-pay | Admitting: Medical

## 2023-11-25 NOTE — Telephone Encounter (Signed)
Patient called and stated that her losartan is no longer working for her. Please call and advise pt.

## 2023-11-28 NOTE — Telephone Encounter (Signed)
Pt sent mychart message to schedule

## 2023-11-30 MED ORDER — LOSARTAN POTASSIUM-HCTZ 50-12.5 MG PO TABS: 1.0000 | ORAL_TABLET | Freq: Every day | ORAL | 3 refills | Status: DC

## 2023-12-21 ENCOUNTER — Other Ambulatory Visit: Payer: Self-pay | Admitting: Medical

## 2023-12-21 ENCOUNTER — Telehealth: Payer: Self-pay

## 2023-12-21 ENCOUNTER — Other Ambulatory Visit (INDEPENDENT_AMBULATORY_CARE_PROVIDER_SITE_OTHER): Payer: Self-pay

## 2023-12-21 ENCOUNTER — Encounter: Payer: Self-pay | Admitting: Physician Assistant

## 2023-12-21 ENCOUNTER — Ambulatory Visit: Payer: BC Managed Care – PPO | Admitting: Physician Assistant

## 2023-12-21 VITALS — Ht 64.0 in | Wt 248.6 lb

## 2023-12-21 DIAGNOSIS — M25561 Pain in right knee: Secondary | ICD-10-CM

## 2023-12-21 DIAGNOSIS — M25562 Pain in left knee: Secondary | ICD-10-CM

## 2023-12-21 MED ORDER — WEGOVY 0.25 MG/0.5ML ~~LOC~~ SOAJ: 0.2500 mg | SUBCUTANEOUS | 0 refills | Status: DC

## 2023-12-21 NOTE — Telephone Encounter (Signed)
 Copied from CRM [id number]. Topic: Clinical - Medication Question >> Dec 21, 2023  9:27 AM Adaysia C wrote: Reason for CRM: Patient has questions for the provider about her weight loss injection (Semaglutide -Weight Management (WEGOVY ) 0.25 MG/0.5ML SOAJ). Please follow up with patient [phone]

## 2023-12-21 NOTE — Telephone Encounter (Signed)
 Copied from CRM [id number]. Topic: Clinical - Medication Refill >> Dec 21, 2023  9:23 AM Kara BROCKS wrote: Most Recent Primary Care Visit:  Provider: DORINA LOVING  Department: [facility]  Visit Type: MYCHART VIDEO VISIT  Date: 07/06/2023  Medication: Semaglutide -Weight Management (WEGOVY ) 0.25 MG/0.5ML SOAJ  Has the patient contacted their pharmacy? No, patient initiated the RX refill through providers office (Agent: If no, request that the patient contact the pharmacy for the refill. If patient does not wish to contact the pharmacy document the reason why and proceed with request.) (Agent: If yes, when and what did the pharmacy advise?)  Is this the correct pharmacy for this prescription? Yes If no, delete pharmacy and type the correct one.  This is the patient's preferred pharmacy:   [id number] - [place], [place] - [address] AT [facility] [address] [place] [address] Phone: [phone] Fax: [phone]   Has the prescription been filled recently? No  Is the patient out of the medication? Yes  Has the patient been seen for an appointment in the last year OR does the patient have an upcoming appointment? Yes  Can we respond through MyChart?   Agent: Please be advised that Rx refills may take up to 3 business days. We ask that you follow-up with your pharmacy.

## 2023-12-21 NOTE — Progress Notes (Signed)
 Office Visit Note   Patient: Whitney Gray           Date of Birth: Sep 16, 1971           MRN: [id number] Visit Date: 12/21/2023              Requested by: Dorina Loving, PA-C [id number] [place],  [place] [address] PCP: Dorina Loving, PA-C   Assessment & Plan: Visit Diagnoses:  1. Acute pain of right knee   2. Acute pain of left knee   3. 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  She has had ongoing pain and trouble with her knees since she was in her 11s.  She has failed conservative care and treatment including steroid injections.  X-rays demonstrate advanced tricompartmental arthritis with subluxation on the left.  She is not interested in any more therapeutics but would like to get knee replacement in the spring.  Left knee is more symptomatic than right.  She is currently working with her primary care doctor to get on Ozempic .  She needs to lose about 16 to 18 pounds to be at the appropriate BMI.  She is otherwise fairly healthy.  She does have a family history of arthritis.  I had a long discussion with her and showed her a model of the knee replacement discussed risks benefits recovery.  She also was able to meet with Dr. Addie today.  She will follow-up with him in 6 weeks.  Follow-Up Instructions: No follow-ups on file.   Orders:  Orders Placed This Encounter  Procedures   XR Knee 1-2 Views Right   XR Knee 1-2 Views Left   No orders of the defined types were placed in this encounter.     Procedures: No procedures performed   Clinical Data: No additional findings.   Subjective: Chief Complaint  Patient presents with   Right Knee - Pain   Left Knee - Pain    HPI pleasant 54 year old woman with a chief complaint of left greater than right knee pain for quite a long time has gotten significantly worse in the last year or so.  She has tried steroid  injections which seems to help her but now are very minimally effective.  She is not diabetic.  She has a BMI of 42.  She is interested in discussing knee replacement surgery.  She does not have other painful joints but does have a family history of arthritis in the knees  Review of Systems  All other systems reviewed and are negative.    Objective: Vital Signs: LMP  (LMP Unknown)   Physical Exam Constitutional:      Appearance: Normal appearance.  Pulmonary:     Effort: Pulmonary effort is normal.  Skin:    General: Skin is warm and dry.  Neurological:     General: No focal deficit present.     Mental Status: She is alert.  Psychiatric:        Mood and Affect: Mood normal.        Behavior: Behavior normal.     Ortho Exam Bilateral knees she comes to full extension bilaterally she is neurovascularly intact no effusion she has significant grinding with range of motion.  Compartments are soft and nontender strong pulses distally no erythema Specialty Comments:  No specialty comments available.  Imaging: No results found.   PMFS History:  Patient Active Problem List   Diagnosis Date Noted   Bilateral knee pain 12/21/2023   Cholelithiasis with chronic cholecystitis 08/05/2021   Varicose veins of both lower extremities with pain 11/20/2020   Encounter for screening mammogram for malignant neoplasm of breast 09/20/2017   Hypertension 04/05/2017   Dandruff 07/10/2015   Seasonal allergies 07/10/2015   Pituitary adenoma (HCC) 07/10/2015   Obesity 07/10/2015   Allergic rhinitis 03/13/2015   Sickle cell trait (HCC) 02/14/2015   Vitamin D insufficiency 02/14/2015   Past Medical History:  Diagnosis Date   Amenorrhea    Anemia    Arthritis    Bilateral knees   Asthma    Depression with anxiety 03/25/2020   Heart murmur    Hypertension    Pituitary adenoma (HCC)    Prolactinoma (HCC)     Family History  Problem Relation Age of Onset   Heart disease Mother    Asthma  Mother    Alcohol abuse Father    Arthritis Father    Heart disease Father    Prostate cancer Maternal Grandfather    Diabetes Maternal Grandfather     Past Surgical History:  Procedure Laterality Date   CESAREAN SECTION  2004   CHOLECYSTECTOMY N/A 09/09/2021   Procedure: LAPAROSCOPIC CHOLECYSTECTOMY WITH INTRAOPERATIVE CHOLANGIOGRAM;  Surgeon: Eletha Boas, MD;  Location: [facility];  Service: General;  Laterality: N/A;   TUBAL LIGATION     Social History   Occupational History   Occupation: Med Tech  Tobacco Use   Smoking status: Never   Smokeless tobacco: Never  Vaping Use   Vaping status: Never Used  Substance and Sexual Activity   Alcohol use: No    Alcohol/week: 0.0 standard drinks of alcohol   Drug use: No   Sexual activity: Not Currently    Partners: Male    Birth control/protection: None

## 2023-12-21 NOTE — Telephone Encounter (Signed)
 Needs updated OV for bmi and wt check for PA please.

## 2023-12-22 NOTE — Telephone Encounter (Signed)
 Pt has an upcoming appt

## 2023-12-22 NOTE — Telephone Encounter (Signed)
 Appt scheduled 01/02/24

## 2023-12-30 NOTE — Telephone Encounter (Signed)
Appt rescheduled 01/13/24

## 2024-01-02 ENCOUNTER — Encounter: Payer: BC Managed Care – PPO | Admitting: Medical

## 2024-01-13 ENCOUNTER — Encounter: Payer: BC Managed Care – PPO | Admitting: Medical

## 2024-01-13 NOTE — Telephone Encounter (Signed)
 Pt rescheduled to 01/17/24

## 2024-01-17 ENCOUNTER — Telehealth: Payer: Self-pay

## 2024-01-17 ENCOUNTER — Ambulatory Visit (INDEPENDENT_AMBULATORY_CARE_PROVIDER_SITE_OTHER): Payer: BC Managed Care – PPO | Admitting: Medical

## 2024-01-17 ENCOUNTER — Other Ambulatory Visit (HOSPITAL_BASED_OUTPATIENT_CLINIC_OR_DEPARTMENT_OTHER): Payer: Self-pay

## 2024-01-17 VITALS — BP 133/77 | HR 68 | Temp 98.0°F | Resp 16 | Ht 64.0 in | Wt 242.0 lb

## 2024-01-17 DIAGNOSIS — E66813 Obesity, class 3: Secondary | ICD-10-CM | POA: Diagnosis not present

## 2024-01-17 DIAGNOSIS — Z6841 Body Mass Index (BMI) 40.0 and over, adult: Secondary | ICD-10-CM

## 2024-01-17 DIAGNOSIS — R739 Hyperglycemia, unspecified: Secondary | ICD-10-CM | POA: Diagnosis not present

## 2024-01-17 MED ORDER — WEGOVY 0.25 MG/0.5ML ~~LOC~~ SOAJ
0.2500 mg | SUBCUTANEOUS | 0 refills | Status: DC
  Filled 2024-01-17: qty 2, 28d supply, fill #0

## 2024-01-17 NOTE — Patient Instructions (Addendum)
 Obesity BMI of 41 despite dietary changes including reduced meal frequency and increased fruit and vegetable intake. Weight has fluctuated but overall trend is stable. Discussed the importance of healthy food choices in addition to reduced quantity. Attempted to start Wegovy  but encountered insurance coverage issues. -Resubmit prescription for Wegovy  to in-house pharmacy for potential better insurance feedback. Hopefully will cover. -Encourage continued dietary changes focusing on high fiber foods, fruits, vegetables, and lean proteins.  Prediabetes Previous blood glucose levels close to diabetic range. Last A1c checked on November 19th. -Check A1c and metabolic panel the week of February 24th to reassess for possible diabetes.  Back Pain (para thoracic msk pain left side) Reports of  let side upper back pain, likely musculoskeletal in nature. No current respiratory symptoms or concerns. -Continue current pain management regimen meloxicam 7.5 mg daily. Can increase to 15 mg daily if needed. If pain location changes or worsens, consider chest x-ray. Offered xray today but decline.  Follow-up Pending successful initiation of Wegovy , follow-up in one month. If issues with medication coverage persist, patient to notify provider.

## 2024-01-17 NOTE — Progress Notes (Signed)
   Subjective:    Patient ID: Whitney Gray, female    DOB: 04-04-1971, 53 y.o.   MRN: [id number]  HPI    Review of Systems     Objective:   Physical Exam        Assessment & Plan:

## 2024-01-17 NOTE — Telephone Encounter (Signed)
PA initiated via Covermymeds; KEY: [id number]. Awaiting determination.

## 2024-01-17 NOTE — Progress Notes (Signed)
 Subjective:    Patient ID: Whitney Gray, female    DOB: 04-Mar-1971, 53 y.o.   MRN: [id number]  Discussed the use of AI scribe software for clinical note transcription with the patient, who gave verbal consent to proceed.  History of Present Illness   Whitney Gray is a 53 year old female with obesity and knee pain who presents for weight management and evaluation of joint pain.  She is seeking assistance with weight management and has been unable to obtain Wegovy  due to insurance coverage issues. Her BMI is 41, and she has been attempting to lose weight by reducing her meals to one or two per day over the past three months. Despite these efforts, her weight has fluctuated between 236 and 248 pounds over the last year, currently at 242 pounds. She has a decreased appetite and has been consuming more fruits and vegetables, although she occasionally eats burgers.  She experiences significant knee pain, diagnosed as severe degenerative joint disease, and has been informed that she may need a knee replacement. She had four consecutive days without knee pain last week, but the pain returned after doing yard work. She is committed to maintaining a healthy diet and exercising, provided her knee pain allows.  She also reports back pain, particularly in the thoracic region, which she associates with muscle strain. The pain is exacerbated by certain movements.. Her oxygen saturation levels have been good, and her breathing is normal per pt.  Her blood sugar levels were previously noted to be on the edge of diabetes, with a verage of about   135 mg/dL, just below the diabetic range. No family history of thyroid  issues was reported. No history of pancreatitis. No recent changes in her breathing, and her oxygen saturation has been stable.         Review of Systems See hpi    Objective:   Physical Exam  General Mental Status- Alert. General Appearance- Not in acute distress.   Skin General:  Color- Normal Color. Moisture- Normal Moisture.  Neck Carotid Arteries- Normal color. Moisture- Normal Moisture. No carotid bruits. No JVD.  Chest and Lung Exam Auscultation: Breath Sounds:-Normal.  Cardiovascular Auscultation:Rythm- Regular. Murmurs & Other Heart Sounds:Auscultation of the heart reveals- No Murmurs.  Abdomen Inspection:-Inspeection Normal. Palpation/Percussion:Note:No mass. Palpation and Percussion of the abdomen reveal- Non Tender, Non Distended + BS, no rebound or guarding.   Neurologic Cranial Nerve exam:- CN III-XII intact(No nystagmus), symmetric smile. Strength:- 5/5 equal and symmetric strength both upper and lower extremities.   Back- left upper side. Thoracic area paraspinal tenderness to palpation.  No mid t spine pain or rib area pain.    Assessment & Plan:  Assessment and Plan    Obesity BMI of 41 despite dietary changes including reduced meal frequency and increased fruit and vegetable intake. Weight has fluctuated but overall trend is stable. Discussed the importance of healthy food choices in addition to reduced quantity. Attempted to start Wegovy  but encountered insurance coverage issues. -Resubmit prescription for Wegovy  to in-house pharmacy for potential better insurance feedback. Hopefully will cover. -Encourage continued dietary changes focusing on high fiber foods, fruits, vegetables, and lean proteins.  Prediabetes Previous blood glucose levels close to diabetic range. Last A1c checked on November 19th. -Check A1c and metabolic panel the week of February 24th to reassess for possible diabetes.  Back Pain (para thoracic msk pain left side) Reports of upper back pain, likely musculoskeletal in nature. No current respiratory symptoms or concerns. -Continue current pain management  regimen meloxicam 7.5 mg daily. Can increase to 15 mg daily if needed. If pain location changes or worsens, consider chest x-ray. Offered xray today but  decline.  Follow-up Pending successful initiation of Wegovy , follow-up in one month. If issues with medication coverage persist, patient to notify provider.

## 2024-01-18 ENCOUNTER — Encounter: Payer: Self-pay | Admitting: Medical

## 2024-01-18 ENCOUNTER — Other Ambulatory Visit (HOSPITAL_BASED_OUTPATIENT_CLINIC_OR_DEPARTMENT_OTHER): Payer: Self-pay

## 2024-01-18 NOTE — Telephone Encounter (Signed)
PA approved. Effective 11/16/23 to 07/15/24

## 2024-01-24 ENCOUNTER — Other Ambulatory Visit (HOSPITAL_BASED_OUTPATIENT_CLINIC_OR_DEPARTMENT_OTHER): Payer: Self-pay

## 2024-01-24 ENCOUNTER — Ambulatory Visit (HOSPITAL_COMMUNITY)
Admission: EM | Admit: 2024-01-24 | Discharge: 2024-01-24 | Disposition: A | Discharge status: Home / Self Care | Payer: BC Managed Care – PPO

## 2024-01-24 ENCOUNTER — Encounter (HOSPITAL_COMMUNITY): Payer: Self-pay

## 2024-01-24 DIAGNOSIS — J4521 Mild intermittent asthma with (acute) exacerbation: Secondary | ICD-10-CM

## 2024-01-24 DIAGNOSIS — J101 Influenza due to other identified influenza virus with other respiratory manifestations: Secondary | ICD-10-CM | POA: Diagnosis not present

## 2024-01-24 LAB — POC COVID19/FLU A&B COMBO
Covid Antigen, POC: NEGATIVE
Influenza A Antigen, POC: POSITIVE — AB
Influenza B Antigen, POC: NEGATIVE

## 2024-01-24 MED ORDER — IPRATROPIUM-ALBUTEROL 0.5-2.5 (3) MG/3ML IN SOLN
RESPIRATORY_TRACT | Status: AC
  Filled 2024-01-24: qty 3

## 2024-01-24 MED ORDER — IPRATROPIUM-ALBUTEROL 0.5-2.5 (3) MG/3ML IN SOLN
3.0000 mL | Freq: Once | RESPIRATORY_TRACT | Status: AC
  Administered 2024-01-24: 3 mL via RESPIRATORY_TRACT

## 2024-01-24 NOTE — ED Triage Notes (Signed)
Chief Complaint: Cough at times productive, SOB, back and rib pain from cough, congestion, runny nose, fever, chills, and headache. Needing a negative COVID test to go back to work.   Sick exposure: Yes- Grand-son but no testing was done.   Onset: This past Saturday  Prescriptions or OTC medications tried: Yes- Mucinex and Theraflu    with moderate relief  New foods, medications, or products: No  Recent Travel: No

## 2024-01-24 NOTE — Discharge Instructions (Signed)
He tested positive for flu A today.  As discussed you are outside the window for Tamiflu treatment.  I recommend alternating between Tylenol and ibuprofen as needed for pain and fever.  You can take Mucinex as needed for cough and congestion.  You can use your albuterol inhaler as needed for shortness of breath, chest tightness, and wheezing.  Make sure you are staying hydrated and getting plenty of rest.  Return here as needed.  If you develop difficulty breathing, severe chest pain, or fevers unrelieved by medication please seek immediate medical treatment in the ER.

## 2024-01-24 NOTE — ED Provider Notes (Addendum)
 [facility]    CSN: [id number] Arrival date & time: 01/24/24  1533      History   Chief Complaint Chief Complaint  Patient presents with   Cough    HPI Whitney Gray is a 53 y.o. female.   Patient presents with cough, shortness of breath, back and rib cage pain from cough, congestion, runny nose, fever, chills, and headache that began on Saturday.  Patient is requesting COVID testing for work.  History of asthma.  Denies chest pain, abdominal pain, nausea, vomiting, and diarrhea.   Cough   Past Medical History:  Diagnosis Date   Amenorrhea    Anemia    Arthritis    Bilateral knees   Asthma    Depression with anxiety 03/25/2020   Heart murmur    Hypertension    Pituitary adenoma (HCC)    Prolactinoma [facility])     Patient Active Problem List   Diagnosis Date Noted   Bilateral knee pain 12/21/2023   Cholelithiasis with chronic cholecystitis 08/05/2021   Varicose veins of both lower extremities with pain 11/20/2020   Encounter for screening mammogram for malignant neoplasm of breast 09/20/2017   Hypertension 04/05/2017   Dandruff 07/10/2015   Seasonal allergies 07/10/2015   Pituitary adenoma (HCC) 07/10/2015   Obesity 07/10/2015   Allergic rhinitis 03/13/2015   Sickle cell trait (HCC) 02/14/2015   Vitamin D insufficiency 02/14/2015    Past Surgical History:  Procedure Laterality Date   CESAREAN SECTION  2004   CHOLECYSTECTOMY N/A 09/09/2021   Procedure: LAPAROSCOPIC CHOLECYSTECTOMY WITH INTRAOPERATIVE CHOLANGIOGRAM;  Surgeon: Darnell Level, MD;  Location: [facility];  Service: General;  Laterality: N/A;   TUBAL LIGATION      OB History   No obstetric history on file.      Home Medications    Prior to Admission medications   Medication Sig Start Date End Date Taking? Authorizing Provider  albuterol (VENTOLIN HFA) 108 (90 Base) MCG/ACT inhaler Inhale 2 puffs into the lungs every 6 (six) hours as needed. 03/10/22  Yes Saguier, Ramon Dredge, PA-C   Aspirin-Acetaminophen-Caffeine (GOODY HEADACHE PO) Take 1 packet by mouth daily as needed (pain).   Yes [provider]  Aspirin-Caffeine (BC FAST PAIN RELIEF PO) Take 1 packet by mouth daily as needed (pain).   Yes [provider]  losartan-hydrochlorothiazide (HYZAAR) 50-12.5 MG tablet Take 1 tablet by mouth daily. 11/30/23  Yes Saguier, Ramon Dredge, PA-C  meloxicam (MOBIC) 15 MG tablet Take 15 mg by mouth daily.   Yes [provider]  bismuth subsalicylate (PEPTO BISMOL) 262 MG/15ML suspension Take 30 mLs by mouth every 6 (six) hours as needed for indigestion or diarrhea or loose stools.    [provider]    Family History Family History  Problem Relation Age of Onset   Heart disease Mother    Asthma Mother    Alcohol abuse Father    Arthritis Father    Heart disease Father    Prostate cancer Maternal Grandfather    Diabetes Maternal Grandfather     Social History Social History   Tobacco Use   Smoking status: Never   Smokeless tobacco: Never  Vaping Use   Vaping status: Never Used  Substance Use Topics   Alcohol use: No    Alcohol/week: 0.0 standard drinks of alcohol   Drug use: No     Allergies   Latex and Codeine   Review of Systems Review of Systems  Respiratory:  Positive for cough.    Per  HPI  Physical Exam Triage Vital Signs ED Triage Vitals  Encounter Vitals Group     BP 01/24/24 1709 (!) 143/85     Systolic BP Percentile --      Diastolic BP Percentile --      Pulse Rate 01/24/24 1709 70     Resp 01/24/24 1709 18     Temp 01/24/24 1709 98 F (36.7 C)     Temp Source 01/24/24 1709 Oral     SpO2 01/24/24 1709 98 %     Weight 01/24/24 1709 240 lb (108.9 kg)     Height 01/24/24 1709 5\' 3"  (1.6 m)     Head Circumference --      Peak Flow --      Pain Score 01/24/24 1706 0     Pain Loc --      Pain Education --      Exclude from Growth Chart --    No data found.  Updated Vital Signs BP (!) 143/85 (BP  Location: Right Arm)   Pulse 70   Temp 98 F (36.7 C) (Oral)   Resp 18   Ht 5\' 3"  (1.6 m)   Wt 240 lb (108.9 kg)   LMP  (LMP Unknown)   SpO2 98%   BMI 42.51 kg/m   Visual Acuity Right Eye Distance:   Left Eye Distance:   Bilateral Distance:    Right Eye Near:   Left Eye Near:    Bilateral Near:     Physical Exam Vitals and nursing note reviewed.  Constitutional:      General: She is awake. She is not in acute distress.    Appearance: Normal appearance. She is well-developed and well-groomed. She is not ill-appearing.  HENT:     Nose: Congestion and rhinorrhea present.     Mouth/Throat:     Mouth: Mucous membranes are moist.     Pharynx: Posterior oropharyngeal erythema present. No oropharyngeal exudate.  Cardiovascular:     Rate and Rhythm: Normal rate and regular rhythm.  Pulmonary:     Effort: Pulmonary effort is normal.     Breath sounds: Examination of the right-lower field reveals wheezing. Examination of the left-lower field reveals wheezing. Wheezing present.  Musculoskeletal:        General: Normal range of motion.     Cervical back: Normal range of motion and neck supple.  Skin:    General: Skin is warm and dry.  Neurological:     Mental Status: She is alert.  Psychiatric:        Behavior: Behavior is cooperative.      UC Treatments / Results  Labs (all labs ordered are listed, but only abnormal results are displayed) Labs Reviewed  POC COVID19/FLU A&B COMBO - Abnormal; Notable for the following components:      Result Value   Influenza A Antigen, POC Positive (*)    All other components within normal limits    EKG   Radiology No results found.  Procedures Procedures (including critical care time)  Medications Ordered in UC Medications  ipratropium-albuterol (DUONEB) 0.5-2.5 (3) MG/3ML nebulizer solution 3 mL (3 mLs Nebulization Given 01/24/24 1745)    Initial Impression / Assessment and Plan / UC Course  I have reviewed the triage  vital signs and the nursing notes.  Pertinent labs & imaging results that were available during my care of the patient were reviewed by me and considered in my medical decision making (see chart for details).  Patient presented with 4-day history of cough, shortness of breath, back and rib cage pain from cough, congestion, runny nose, fever, chills, and headache.  Requesting COVID testing for work.  History of asthma.  Denies any other symptoms.  Upon assessment congestion and rhinorrhea present, mild erythema noted to pharynx.  Mild wheezing noted in bilateral lower lungs.  DuoNeb give with relief of wheezing.  Tested positive for influenza A.  Discussed over-the-counter medication to take as needed for symptoms.  Discussed using albuterol inhaler as needed.  Discussed return and strict ER precautions. Final Clinical Impressions(s) / UC Diagnoses   Final diagnoses:  Influenza A  Mild intermittent asthma with acute exacerbation     Discharge Instructions      He tested positive for flu A today.  As discussed you are outside the window for Tamiflu treatment.  I recommend alternating between Tylenol and ibuprofen as needed for pain and fever.  You can take Mucinex as needed for cough and congestion.  You can use your albuterol inhaler as needed for shortness of breath, chest tightness, and wheezing.  Make sure you are staying hydrated and getting plenty of rest.  Return here as needed.  If you develop difficulty breathing, severe chest pain, or fevers unrelieved by medication please seek immediate medical treatment in the ER.     ED Prescriptions   None    PDMP not reviewed this encounter.   Letta Kocher, NP 01/24/24 1957    Wynonia Lawman A, NP 01/24/24 [phone]

## 2024-01-30 MED ORDER — SEMAGLUTIDE-WEIGHT MANAGEMENT 0.25 MG/0.5ML ~~LOC~~ SOAJ: 0.2500 mg | SUBCUTANEOUS | 0 refills | Status: DC

## 2024-01-30 NOTE — Addendum Note (Signed)
 Addended by: Gwenevere Abbot on: 01/30/2024 08:32 PM   Modules accepted: Orders

## 2024-02-01 ENCOUNTER — Encounter: Payer: BC Managed Care – PPO | Admitting: Orthopedic Surgery

## 2024-02-06 ENCOUNTER — Ambulatory Visit: Payer: Self-pay | Admitting: Medical

## 2024-02-06 NOTE — Telephone Encounter (Signed)
 Chief Complaint: Congestion Symptoms: Chest and nasal congestion, nighttime SOB, nighttime chest tightness , sinus pain, productive cough Frequency: Worse at night Pertinent Negatives: Patient denies fever, n/v, sore throat, ear ache. Disposition: [] ED /[] Urgent Care (no appt availability in office) / [x] Appointment(In office/virtual)/ []  [facility]/ [] Home Care/ [] Refused Recommended Disposition /[] [facility]/ []  Follow-up with PCP Additional Notes: Patient called with complaints of flu symptoms that have worsened/not improved. Patient states she tested positive for the flu two weeks ago and was seen in the UC  here she given a breathing treatment. Patient states that the breathing treatment has brought on a cough and now patient has difficulty breathing at night when lying supine. Nighttime SOB is accompanied with chest tightness and relieved with sitting up. Patient states she has chest congestion that she has difficulty clearing with couching and states only sometimes is her cough productive with yellow sputum of a "not a lot" amount. Patient states that she feels like her head in congested and frequently has a stuffy nose. Patient she she has developed sinus pain around her eyes, cheeks, and nose (2/10). Patient has been taking theraflu and mucinex with little relief. Patient states she is not having difficulty breathing or chest tightness now during triage and denies fever, jaw pain, n/v, sore throat or ear ache. Patient advised by this RN to be seen within 24 hours to which patient was agreeable. Appt scheduled. Patient advised by this RN to call back with worsening symptoms. Patient verbalized understanding. Copied from CRM [id number]. Topic: Clinical - Red Word Triage >> Feb 06, 2024  5:18 PM Whitney Gray wrote: Red Word that prompted transfer to Nurse Triage: Pt having trouble breathing when lying down. Congestion, tight chest. Reason for Disposition  [1] HIGH RISK (e.g.,  age > 64 years, pregnant, HIV+, or chronic medical condition) AND [2]  > 72 hours (3 days) since evaluated by doctor (or NP/PA) AND [3] symptoms not improved  Answer Assessment - Initial Assessment Questions 2. MEDICINES: "Were you prescribed any medications for the influenza?"  (e.g., zanamivir [Relenza], oseltamavir [Tamiflu]).      Mucinex, theraflu 3. ONSET of SYMPTOMS: "When did your symptoms start?"     2 weeks ago "I was seen at the [facility] and they gave me a breathing treatment. It made me start coughing now." 4. SYMPTOMS: "What symptoms are you most concerned about?" (e.g., runny nose, stuffy nose, sore throat, cough, breathing difficulty, fever)     Chest congestion, nasal congestion, productive cough 5. COUGH: "How bad is the cough?"     Mild-moderate worse at night. 6. FEVER: "Do you have a fever?" If Yes, ask: "What is your temperature, how was it measured, and when did it start?"     Denies 7. RESPIRATORY DISTRESS: "Are you having any trouble breathing?" If Yes, ask: "Describe your breathing."      "I'm okay right now the problem is at night when I lay down on my back my chest gets tight and I can't breathe very well. I do better when I sit up but all this congestion in my chest I can't hardly cough it up."  10. HIGH RISK for COMPLICATIONS: "Do you have any heart or lung problems? Do you have a weakened immune system?" (e.g., CHF, COPD, asthma, HIV positive, chemotherapy, renal failure, diabetes mellitus, sickle cell anemia)       Asthma - manages with inhalers "Most of the time I don't even need to use it."  Protocols used:  Influenza (Flu) Follow-up Call-[facility]

## 2024-02-07 ENCOUNTER — Telehealth: Payer: Self-pay | Admitting: Medical

## 2024-02-07 ENCOUNTER — Ambulatory Visit: Payer: BC Managed Care – PPO | Admitting: Medical

## 2024-02-07 NOTE — Telephone Encounter (Signed)
 Called pt, pt has an appt with PCP at 1:40 today (02-07-24) as Virtual, provider stated needs to see pt In person (in office) pt is aware but will call back in 30 mins to let us know if ok to change to in person appt or needing to r/s for another day.

## 2024-02-07 NOTE — Telephone Encounter (Signed)
 Copied from CRM [id number]. Topic: Appointments - Appointment Cancel/Reschedule >> Feb 07, 2024  1:49 PM Sim Boast F wrote: Patient called to cancel acute appointment for today at 1:40pm, says it was originally set up as virtual appointment then changed to in person and patient cannot come in person, she's stuck at work. Says she's going to play it by ear for now. After hanging up I did call patient and leave her a voicemail to see if she wants to reschedule.

## 2024-02-07 NOTE — Telephone Encounter (Signed)
 Pt called and lvm to return call in regards to symptoms

## 2024-02-10 ENCOUNTER — Telehealth: Payer: Self-pay

## 2024-02-10 MED ORDER — SEMAGLUTIDE-WEIGHT MANAGEMENT 0.25 MG/0.5ML ~~LOC~~ SOAJ: 0.2500 mg | SUBCUTANEOUS | 0 refills | Status: DC

## 2024-02-10 NOTE — Telephone Encounter (Signed)
 Rx sent  Copied from CRM [id number]. Topic: Clinical - Medication Refill >> Feb 10, 2024  2:20 PM Whitney Gray wrote: Most Recent Primary Care Visit:  Provider: Esperanza Richters  Department: [facility]  Visit Type: PHYSICAL  Date: 01/17/2024  Medication: Semaglutide-Weight Management 0.25 MG/0.5ML SOAJ   Has the patient contacted their pharmacy? Yes (Agent: If no, request that the patient contact the pharmacy for the refill. If patient does not wish to contact the pharmacy document the reason why and proceed with request.) (Agent: If yes, when and what did the pharmacy advise?)  Is this the correct pharmacy for this prescription? Yes If no, delete pharmacy and type the correct one.  This is the patient's preferred pharmacy:   [facility] - [place], [place] - [address] [place] [address] Phone: [phone] Fax: [phone]    Has the prescription been filled recently? Yes, next prescription she would like it to go to [facility] due to being less cost thank [facility].   Is the patient out of the medication? No  Has the patient been seen for an appointment in the last year OR does the patient have an upcoming appointment? Yes  Can we respond through MyChart? Yes  Agent: Please be advised that Rx refills may take up to 3 business days. We ask that you follow-up with your pharmacy.

## 2024-02-10 NOTE — Telephone Encounter (Signed)
 Please see message below for a 3 month supply of Wegovy to be sent to [facility].  Requesting: Semaglutide-Weight Management 0.25mg   Last Visit: 01/17/2024 Next Visit: 02/07/2024 Last Refill: 02/10/24  Please Advise

## 2024-02-11 MED ORDER — SEMAGLUTIDE-WEIGHT MANAGEMENT 0.25 MG/0.5ML ~~LOC~~ SOAJ: SUBCUTANEOUS | 0 refills | Status: DC

## 2024-02-11 NOTE — Addendum Note (Signed)
 Addended by: Gwenevere Abbot on: 02/11/2024 10:04 AM   Modules accepted: Orders

## 2024-02-15 ENCOUNTER — Ambulatory Visit (INDEPENDENT_AMBULATORY_CARE_PROVIDER_SITE_OTHER): Payer: BC Managed Care – PPO | Admitting: Orthopedic Surgery

## 2024-02-15 DIAGNOSIS — M1712 Unilateral primary osteoarthritis, left knee: Secondary | ICD-10-CM

## 2024-02-16 ENCOUNTER — Encounter: Payer: Self-pay | Admitting: Orthopedic Surgery

## 2024-02-16 ENCOUNTER — Encounter: Payer: Self-pay | Admitting: Medical

## 2024-02-16 NOTE — Progress Notes (Signed)
 Office Visit Note   Patient: Whitney Gray           Date of Birth: 03-28-1971           MRN: [id number] Visit Date: 02/15/2024 Requested by: Esperanza Richters, PA-C [address] [place],  [place] [address] PCP: Marisue Brooklyn  Subjective: Chief Complaint  Patient presents with   Right Knee - Pain   Left Knee - Pain    HPI: Whitney Gray is a 53 y.o. female who presents to the office reporting bilateral knee pain left worse than right.  Was seen on 12/21/2023.  Patient has had pain for years in the left knee.  Would like to have surgery as soon as possible.  No personal or family history of DVT or pulmonary embolism.  Has 3 steps at her house.  43 year old at home along with her husband.  Describes pain with activities of daily living as well as pain with walking.  She works at [employer] 2 jobs 12 to 16 hours which has been a little bit harder on her knees.  Takes meloxicam and Goody powders but the [place] powders gives her nosebleeds.  The pain does wake her from sleep..                ROS: All systems reviewed are negative as they relate to the chief complaint within the history of present illness.  Patient denies fevers or chills.  Assessment & Plan: Visit Diagnoses: No diagnosis found.  Plan: Impression is end-stage left knee arthritis.  Plan at this time is left total knee replacement.  The risk benefits are discussed with the patient include not limited to infection or vessel damage incomplete pain relief as well as incomplete functional restoration.  The rigorous nature of the rehabilitative process is discussed.  Currently she will need to have a weight of 220 to get to a 39 BMI which would qualify her for elective surgery.  She will let us know when that is the case.  In the meantime we will prescribe tramadol.  Continue with nonweightbearing quad strengthening exercises.  All questions answered  Follow-Up Instructions: No follow-ups on file.   Orders:  No orders  of the defined types were placed in this encounter.  No orders of the defined types were placed in this encounter.     Procedures: No procedures performed   Clinical Data: No additional findings.  Objective: Vital Signs: LMP  (LMP Unknown)   Physical Exam:  Constitutional: Patient appears well-developed HEENT:  Head: Normocephalic Eyes:EOM are normal Neck: Normal range of motion Cardiovascular: Normal rate Pulmonary/chest: Effort normal Neurologic: Patient is alert Skin: Skin is warm Psychiatric: Patient has normal mood and affect  Ortho Exam: Ortho exam demonstrates slightly antalgic gait to the left.  Pedal pulses palpable.  Collateral cruciate ligaments are stable.  Patient has about a 5 degree flexion contracture on the left but can bend easily past 90.  Extensor mechanism intact.  No calf tenderness negative Homans on the left-hand side.  Specialty Comments:  No specialty comments available.  Imaging: No results found.   PMFS History: Patient Active Problem List   Diagnosis Date Noted   Bilateral knee pain 12/21/2023   Cholelithiasis with chronic cholecystitis 08/05/2021   Varicose veins of both lower extremities with pain 11/20/2020   Encounter for screening mammogram for malignant neoplasm of breast 09/20/2017   Hypertension 04/05/2017   Dandruff 07/10/2015   Seasonal allergies 07/10/2015   Pituitary adenoma (  HCC) 07/10/2015   Obesity 07/10/2015   Allergic rhinitis 03/13/2015   Sickle cell trait (HCC) 02/14/2015   Vitamin D insufficiency 02/14/2015   Past Medical History:  Diagnosis Date   Amenorrhea    Anemia    Arthritis    Bilateral knees   Asthma    Depression with anxiety 03/25/2020   Heart murmur    Hypertension    Pituitary adenoma (HCC)    Prolactinoma (HCC)     Family History  Problem Relation Age of Onset   Heart disease Mother    Asthma Mother    Alcohol abuse Father    Arthritis Father    Heart disease Father    Prostate  cancer Maternal Grandfather    Diabetes Maternal Grandfather     Past Surgical History:  Procedure Laterality Date   CESAREAN SECTION  2004   CHOLECYSTECTOMY N/A 09/09/2021   Procedure: LAPAROSCOPIC CHOLECYSTECTOMY WITH INTRAOPERATIVE CHOLANGIOGRAM;  Surgeon: Darnell Level, MD;  Location: [facility];  Service: General;  Laterality: N/A;   TUBAL LIGATION     Social History   Occupational History   Occupation: Med Tech  Tobacco Use   Smoking status: Never   Smokeless tobacco: Never  Vaping Use   Vaping status: Never Used  Substance and Sexual Activity   Alcohol use: No    Alcohol/week: 0.0 standard drinks of alcohol   Drug use: No   Sexual activity: Not Currently    Partners: Male    Birth control/protection: None

## 2024-02-19 MED ORDER — TRAMADOL HCL 50 MG PO TABS: 50.0000 mg | ORAL_TABLET | Freq: Four times a day (QID) | ORAL | 0 refills | Status: AC | PRN

## 2024-03-09 ENCOUNTER — Ambulatory Visit: Admitting: Medical

## 2024-03-16 ENCOUNTER — Ambulatory Visit: Admitting: Medical

## 2024-03-16 VITALS — BP 121/52 | HR 80 | Temp 97.9°F | Resp 18 | Ht 63.0 in | Wt 236.2 lb

## 2024-03-16 DIAGNOSIS — Z6841 Body Mass Index (BMI) 40.0 and over, adult: Secondary | ICD-10-CM

## 2024-03-16 DIAGNOSIS — E66813 Obesity, class 3: Secondary | ICD-10-CM | POA: Diagnosis not present

## 2024-03-16 DIAGNOSIS — L989 Disorder of the skin and subcutaneous tissue, unspecified: Secondary | ICD-10-CM

## 2024-03-16 DIAGNOSIS — M25562 Pain in left knee: Secondary | ICD-10-CM

## 2024-03-16 DIAGNOSIS — M25561 Pain in right knee: Secondary | ICD-10-CM

## 2024-03-16 DIAGNOSIS — I10 Essential (primary) hypertension: Secondary | ICD-10-CM | POA: Diagnosis not present

## 2024-03-16 DIAGNOSIS — R739 Hyperglycemia, unspecified: Secondary | ICD-10-CM

## 2024-03-16 DIAGNOSIS — E785 Hyperlipidemia, unspecified: Secondary | ICD-10-CM

## 2024-03-16 MED ORDER — SEMAGLUTIDE-WEIGHT MANAGEMENT 0.5 MG/0.5ML ~~LOC~~ SOAJ: 0.5000 mg | SUBCUTANEOUS | 0 refills | Status: AC

## 2024-03-16 NOTE — Progress Notes (Signed)
 Subjective:    Patient ID: Whitney Gray, female    DOB: Sep 17, 1971, 53 y.o.   MRN: [id number]  HPI Discussed the use of AI scribe software for clinical note transcription with the patient, who gave verbal consent to proceed.  History of Present Illness   Whitney Gray is a 53 year old female with bilateral knee arthritis who presents with severe knee pain and swelling.  She experiences severe pain and swelling in both knees, with the left knee being more affected. The pain is described as 'terrible' and is located at the base of the patella, radiating behind the knee and sometimes extending up and down the leg. The pain has worsened over the past week, affecting her walking and sleep. She has a history of bilateral knee arthritis and attributes her knee issues to a significant fall in her twenties. She has experienced knee pain since her early twenties, with the left knee diagnosed with end-stage arthritis.  She has been prescribed tramadol for pain management but has not taken it due to concerns about codeine allergy, as she has had adverse reactions to codeine in the past. She previously used ibuprofen for pain relief but had to stop due to overuse and subsequent migraines.  She has been using [facility] for weight loss since March 2025, having taken four doses. Her weight has decreased from 248 pounds in January to 236 pounds currently. Her family has noticed her weight loss, and she has observed her clothes fitting looser. She mentions a history of decreased appetite and increased exercise, which initially aided in weight loss. She was riding an exercise bike, which seemed to flare her knee pain.   No pain in popliteal area left side and no calf swelling.           Review of Systems  Constitutional:  Negative for chills, fatigue and fever.  HENT:  Negative for dental problem and ear discharge.   Respiratory:  Negative for cough, chest tightness, shortness of breath and wheezing.    Cardiovascular:  Negative for chest pain and palpitations.  Gastrointestinal:  Negative for abdominal pain.  Genitourinary:  Negative for dysuria and frequency.  Musculoskeletal:  Negative for back pain.       Knee pains.  Neurological:  Negative for dizziness, tremors, seizures and headaches.  Hematological:  Negative for adenopathy. Does not bruise/bleed easily.  Psychiatric/Behavioral:  Negative for behavioral problems and confusion.     Past Medical History:  Diagnosis Date   Amenorrhea    Anemia    Arthritis    Bilateral knees   Asthma    Depression with anxiety 03/25/2020   Heart murmur    Hypertension    Pituitary adenoma (HCC)    Prolactinoma (HCC)      Social History   Socioeconomic History   Marital status: Married    Spouse name: Not on file   Number of children: 2   Years of education: 18   Highest education level: Bachelor's degree (e.g., BA, AB, BS)  Occupational History   Occupation: Med Tech  Tobacco Use   Smoking status: Never   Smokeless tobacco: Never  Vaping Use   Vaping status: Never Used  Substance and Sexual Activity   Alcohol use: No    Alcohol/week: 0.0 standard drinks of alcohol   Drug use: No   Sexual activity: Not Currently    Partners: Male    Birth control/protection: None  Other Topics Concern   Not on file  Social History Narrative  Fun: Play games   Denies abuse and feels safe at home.   Denies religious beliefs effecting health care.    Social Drivers of Corporate investment banker Strain: Low Risk  (03/16/2024)   Overall Financial Resource Strain (CARDIA)    Difficulty of Paying Living Expenses: Not hard at all  Food Insecurity: No Food Insecurity (03/16/2024)   Hunger Vital Sign    Worried About Running Out of Food in the Last Year: Never true    Ran Out of Food in the Last Year: Never true  Transportation Needs: No Transportation Needs (03/16/2024)   PRAPARE - Administrator, Civil Service (Medical): No     Lack of Transportation (Non-Medical): No  Physical Activity: Insufficiently Active (03/16/2024)   Exercise Vital Sign    Days of Exercise per Week: 2 days    Minutes of Exercise per Session: 60 min  Stress: No Stress Concern Present (03/16/2024)   [employer] of Occupational Health - Occupational Stress Questionnaire    Feeling of Stress : Not at all  Social Connections: Socially Integrated (03/16/2024)   Social Connection and Isolation Panel [NHANES]    Frequency of Communication with Friends and Family: More than three times a week    Frequency of Social Gatherings with Friends and Family: Once a week    Attends Religious Services: More than 4 times per year    Active Member of [employer] or Organizations: Yes    Attends Banker Meetings: Patient declined    Marital Status: Married  Catering manager Violence: Unknown (10/07/2023)   Received from [facility]   HITS    Physically Hurt: Not on file    Insult or Talk Down To: Not on file    Threaten Physical Harm: Not on file    Scream or Curse: Not on file    Past Surgical History:  Procedure Laterality Date   CESAREAN SECTION  2004   CHOLECYSTECTOMY N/A 09/09/2021   Procedure: LAPAROSCOPIC CHOLECYSTECTOMY WITH INTRAOPERATIVE CHOLANGIOGRAM;  Surgeon: Darnell Level, MD;  Location: [facility];  Service: General;  Laterality: N/A;   TUBAL LIGATION      Family History  Problem Relation Age of Onset   Heart disease Mother    Asthma Mother    Alcohol abuse Father    Arthritis Father    Heart disease Father    Prostate cancer Maternal Grandfather    Diabetes Maternal Grandfather     Allergies  Allergen Reactions   Latex Rash   Codeine Nausea And Vomiting    Per pt,nausea,vomiting,dizzy.     Current Outpatient Medications on File Prior to Visit  Medication Sig Dispense Refill   albuterol (VENTOLIN HFA) 108 (90 Base) MCG/ACT inhaler Inhale 2 puffs into the lungs every 6 (six) hours as needed. 18 g 0    Aspirin-Acetaminophen-Caffeine (GOODY HEADACHE PO) Take 1 packet by mouth daily as needed (pain).     Aspirin-Caffeine (BC FAST PAIN RELIEF PO) Take 1 packet by mouth daily as needed (pain).     losartan-hydrochlorothiazide (HYZAAR) 50-12.5 MG tablet Take 1 tablet by mouth daily. 90 tablet 3   meloxicam (MOBIC) 15 MG tablet Take 15 mg by mouth daily.     traMADol (ULTRAM) 50 MG tablet Take 1 tablet (50 mg total) by mouth every 6 (six) hours as needed. 30 tablet 0   bismuth subsalicylate (PEPTO BISMOL) 262 MG/15ML suspension Take 30 mLs by mouth every 6 (six) hours as needed for indigestion or diarrhea or  loose stools.     No current facility-administered medications on file prior to visit.    BP (!) 121/52 (BP Location: Right Arm, Patient Position: Sitting, Cuff Size: Large)   Pulse 80   Temp 97.9 F (36.6 C) (Oral)   Resp 18   Ht 5\' 3"  (1.6 m)   Wt 236 lb 3.2 oz (107.1 kg)   LMP  (LMP Unknown)   SpO2 99%   BMI 41.84 kg/m      Objective:   Physical Exam  General Mental Status- Alert. General Appearance- Not in acute distress.    Neck No JVD.  Chest and Lung Exam Auscultation: Breath Sounds:-CTA  Cardiovascular Auscultation:Rythm- RRR Murmurs & Other Heart Sounds:Auscultation of the heart reveals- No Murmurs.  Abdomen Inspection:-Inspeection Normal. Palpation/Percussion:Note:No mass. Palpation and Percussion of the abdomen reveal- Non Tender, Non Distended + BS, no rebound or guarding.   Neurologic Cranial Nerve exam:- CN III-XII intact(No nystagmus), symmetric smile. Strength:- 5/5 equal and symmetric strength both upper and lower extremities.   Derm- rt hand dorsal aspect. Dark sligh raised area about 1 cm in diameter.   Left knee- tender to palpation below tibia. Crepitus on rom. Calves are symetric. No edema. Negative homans signs.    Assessment & Plan:   Assessment and Plan    End-stage left knee arthritis Severe left knee pain with swelling,  exacerbated by activity, affecting sleep. Orthopedist recommended knee replacement post weight loss. Tramadol not taken due to codeine sensitivity. Ibuprofen discontinued due to overuse and migraines. - Contact orthopedist to cancel tramadol prescription at [facility] and send it to [facility]. Educated pt on potential cross reactivity but tramadol and codeine are different meds. Pt can have side effect one but not to the other. If pt uses tramadol should take at home when not driving when family member present. - Avoid lower body exercises; focus on upper body exercises. - Monitor for pain behind knee or calf swelling. If that were to occur the would need ultrasound.  Obesity Weight loss of 12 pounds since January Currently on  Wegovy. Current weight 236 pounds. Goal weight 220 pounds for knee surgery eligibility. No Wegovy side effects reported. - Increase Wegovy dose to 0.5 mg weekly for four weeks. - Monitor weight and report any Wegovy side effects. - Continue upper body exercise to prevent muscle loss.  Hypertension Blood pressure well controlled on losartan 50/12.5 mg daily.  General Health Maintenance Due for routine lab tests to monitor health and manage obesity and hypertension. - Order fasting lipid panel, A1c, and kidney function tests at [facility]. - Schedule lab tests within one to two weeks.   Follow up one month by my chart. Update me on weight loss and if any side effects with higher dose wegovy        Esperanza Richters, PA-C   Time spent with patient today was 45  minutes which consisted of chart revdew, discussing diagnosis, work up treatment and documentation.

## 2024-03-17 NOTE — Patient Instructions (Signed)
 End-stage left knee arthritis Severe left knee pain with swelling, exacerbated by activity, affecting sleep. Orthopedist recommended knee replacement post weight loss. Tramadol not taken due to codeine sensitivity. Ibuprofen discontinued due to overuse and migraines. - Contact orthopedist to cancel tramadol prescription at [facility] and send it to [facility]. Educated pt on potential cross reactivity but tramadol and codeine are different meds. Pt can have side effect one but not to the other. If pt uses tramadol should take at home when not driving when family member present. - Avoid lower body exercises; focus on upper body exercises. - Monitor for pain behind knee or calf swelling. If that were to occur the would need ultrasound.  Obesity Weight loss of 12 pounds since January Currently on  Wegovy. Current weight 236 pounds. Goal weight 220 pounds for knee surgery eligibility. No Wegovy side effects reported. - Increase Wegovy dose to 0.5 mg weekly for four weeks. - Monitor weight and report any Wegovy side effects. - Continue upper body exercise to prevent muscle loss.  Hypertension Blood pressure well controlled on losartan 50/12.5 mg daily.  General Health Maintenance Due for routine lab tests to monitor health and manage obesity and hypertension. - Order fasting lipid panel, A1c, and kidney function tests at [facility]. - Schedule lab tests within one to two weeks.   Follow up one month by my chart. Update me on weight loss and if any side effects with higher dose wegovy

## 2024-04-04 ENCOUNTER — Ambulatory Visit: Payer: BC Managed Care – PPO | Admitting: Dermatology

## 2024-05-17 ENCOUNTER — Ambulatory Visit: Payer: Self-pay

## 2024-05-17 NOTE — Telephone Encounter (Signed)
 FYI Only or Action Required?: FYI only for provider  Patient was last seen in primary care on 03/16/2024 by Sylvia Everts, PA-C. Called Nurse Triage reporting Wheezing. Symptoms began several days ago. Interventions attempted: Nothing. Symptoms are: gradually worsening.  Triage Disposition: Go to ED Now (Notify PCP)--- Declined X2. Pt currently at work and doesn't want to leave early.    Patient/caregiver understands and will follow disposition?: --No Patient educated on pertinent s/s that would warrant emergent help/call 911/ ED/. Pt verbalized understanding.              Copied from CRM [id number]. Topic: Clinical - Red Word Triage >> May 17, 2024  4:52 PM Kevelyn M wrote: Patient having issue breathing. Shortness of breath and wheezing. Patient needs a refill on inhaler. She picked up her inhaler last night and it was empty. Reason for Disposition  [1] MODERATE asthma attack (e.g., SOB at rest, speaks in phrases, audible wheezes) AND [2] doesn't have neb or inhaler available  Answer Assessment - Initial Assessment Questions 1. RESPIRATORY STATUS: "Describe your breathing?" (e.g., wheezing, shortness of breath, unable to speak, severe coughing)      -------- Wheezing ( not audible on the call)     2. ONSET: "When did this asthma attack begin?"      ------------- two days ago    3. TRIGGER: "What do you think triggered this attack?" (e.g., URI, exposure to pollen or other allergen, tobacco smoke)      ---------Currently experiencing a cold.     4. PEAK EXPIRATORY FLOW RATE (PEFR): "Do you use a peak flow meter?" If Yes, ask: "What's the current peak flow? What's your personal best peak flow?"      --------------------------------   5. SEVERITY: "How bad is this attack?"    - MILD: No SOB at rest, mild SOB with walking, speaks normally in sentences, can lie down, no retractions, pulse < 100. (GREEN Zone: PEFR 80-100%)   - MODERATE: SOB at rest, SOB with minimal exertion  and prefers to sit, cannot lie down flat, speaks in phrases, mild retractions, audible wheezing, pulse 100-120. (YELLOW Zone: PEFR 50-79%)    - SEVERE: Struggling for each breath, speaks in single words, struggling to breathe, sitting hunched forward, retractions, usually loud wheezing, sometimes minimal wheezing because of decreased air movement, pulse > 120. (RED Zone: PEFR < 50%).      -------------------Moderate     6. ASTHMA MEDICINES:  "What treatments have you tried?"    - INHALED QUICK RELIEF (RESCUE): "What is your inhaled quick-relief medicine?" (e.g., albuterol , salbutamol) "Do you use an inhaler or a nebulizer?" "How frequently have you been using this medicine?"   - CONTROLLER (LONG-TERM-CONTROL): "Do you take an inhaled steroid? (e.g., Asmanex, Flovent , Pulmicort, Qvar)     ---- Has an inhaler. Ran out of medication    7. INHALED QUICK-RELIEF TREATMENTS FOR THIS ATTACK: "What treatments have you given yourself so far?" and "How many and how often?" If using an inhaler, ask, "How many puffs?" Note: Routine treatments are 2 puffs every 4 hours as needed. Rescue treatments are 4 puffs repeated every 20 minutes, up to three times as needed.      ------------------------- Ran out     8. OTHER SYMPTOMS: "Do you have any other symptoms? (e.g., chest pain, coughing up yellow sputum, fever, runny nose)     ---------------Cough-- Sputum: yellow -------------------     9. O2 SATURATION MONITOR:  "Do you use an oxygen saturation monitor (pulse oximeter)  at home?" If Yes, "What is your reading (oxygen level) today?" "What is your usual oxygen saturation reading?" (e.g., 95%)     -------------------- Unable  Protocols used: Asthma Attack-A-AH

## 2024-05-18 ENCOUNTER — Encounter: Payer: Self-pay | Admitting: *Deleted

## 2024-05-18 NOTE — Telephone Encounter (Addendum)
 No answer/ mailbox full.  Mychart sent to pt to schedule appointment.

## 2024-08-02 ENCOUNTER — Other Ambulatory Visit: Payer: Self-pay | Admitting: Medical

## 2024-09-06 ENCOUNTER — Telehealth: Payer: Self-pay | Admitting: Orthopedic Surgery

## 2024-09-06 NOTE — Telephone Encounter (Signed)
 Per Dr. Sedrick last noteCurrently she will need to have a weight of 220 to get to a 39 BMI which would qualify her for elective surgery. She will let us  know when that is the case. In the meantime we will prescribe tramadol . Continue with nonweightbearing quad strengthening exercises.  LVM for pt to cb, will need an appt with Dr Addie for a weight check

## 2024-09-06 NOTE — Telephone Encounter (Signed)
 Patient called and said she is tired and hurting. She is ready for surgery. CB#[phone]

## 2024-10-15 ENCOUNTER — Encounter: Payer: Self-pay | Admitting: Radiology

## 2024-10-16 DIAGNOSIS — M25562 Pain in left knee: Secondary | ICD-10-CM | POA: Diagnosis not present

## 2024-10-16 DIAGNOSIS — M25561 Pain in right knee: Secondary | ICD-10-CM | POA: Diagnosis not present

## 2024-10-16 DIAGNOSIS — M170 Bilateral primary osteoarthritis of knee: Secondary | ICD-10-CM | POA: Diagnosis not present

## 2024-10-16 DIAGNOSIS — G8929 Other chronic pain: Secondary | ICD-10-CM | POA: Diagnosis not present
